# Patient Record
Sex: Male | Born: 1970 | Race: Black or African American | Hispanic: No | Marital: Married | Smoking: Never smoker
Health system: Southern US, Community
[De-identification: ages and names within clinical notes are randomized; demographics above are authoritative.]

## PROBLEM LIST (undated history)

## (undated) DIAGNOSIS — I428 Other cardiomyopathies: Secondary | ICD-10-CM

## (undated) DIAGNOSIS — R7303 Prediabetes: Secondary | ICD-10-CM

## (undated) DIAGNOSIS — M109 Gout, unspecified: Secondary | ICD-10-CM

## (undated) DIAGNOSIS — I10 Essential (primary) hypertension: Secondary | ICD-10-CM

## (undated) DIAGNOSIS — D869 Sarcoidosis, unspecified: Secondary | ICD-10-CM

## (undated) HISTORY — PX: NO PAST SURGERIES: SHX2092

## (undated) HISTORY — DX: Sarcoidosis, unspecified: D86.9

## (undated) HISTORY — DX: Other cardiomyopathies: I42.8

## (undated) HISTORY — DX: Morbid (severe) obesity due to excess calories: E66.01

## (undated) HISTORY — DX: Essential (primary) hypertension: I10

## (undated) HISTORY — DX: Prediabetes: R73.03

## (undated) HISTORY — DX: Gout, unspecified: M10.9

---

## 2008-06-15 ENCOUNTER — Ambulatory Visit: Payer: Self-pay | Admitting: Family Medicine

## 2008-06-15 DIAGNOSIS — F528 Other sexual dysfunction not due to a substance or known physiological condition: Secondary | ICD-10-CM

## 2008-06-15 DIAGNOSIS — R350 Frequency of micturition: Secondary | ICD-10-CM

## 2008-06-15 LAB — CONVERTED CEMR LAB
Bilirubin Urine: NEGATIVE
Blood in Urine, dipstick: NEGATIVE
Glucose, Urine, Semiquant: NEGATIVE
Ketones, urine, test strip: NEGATIVE
Specific Gravity, Urine: 1.025
Urobilinogen, UA: 0.2

## 2008-06-16 ENCOUNTER — Encounter: Payer: Self-pay | Admitting: Family Medicine

## 2008-06-16 DIAGNOSIS — R740 Nonspecific elevation of levels of transaminase and lactic acid dehydrogenase [LDH]: Secondary | ICD-10-CM

## 2008-06-16 LAB — CONVERTED CEMR LAB
AST: 39 units/L — ABNORMAL HIGH (ref 0–37)
Alkaline Phosphatase: 286 units/L — ABNORMAL HIGH (ref 39–117)
BUN: 21 mg/dL (ref 6–23)
Basophils Relative: 0 % (ref 0–1)
Eosinophils Absolute: 0 10*3/uL (ref 0.0–0.7)
Eosinophils Relative: 0 % (ref 0–5)
Glucose, Bld: 130 mg/dL — ABNORMAL HIGH (ref 70–99)
HCT: 42.9 % (ref 39.0–52.0)
HCV Ab: NEGATIVE
Hep A IgM: NEGATIVE
Hepatitis B Surface Ag: NEGATIVE
Lymphs Abs: 0.8 10*3/uL (ref 0.7–4.0)
MCHC: 32.9 g/dL (ref 30.0–36.0)
MCV: 84 fL (ref 78.0–100.0)
PSA: 0.47 ng/mL (ref 0.10–4.00)
Platelets: 227 10*3/uL (ref 150–400)
RBC / HPF: NONE SEEN (ref <3)
Sex Hormone Binding: 22 nmol/L (ref 13–71)
Sodium: 134 meq/L — ABNORMAL LOW (ref 135–145)
Testosterone: 130.36 ng/dL — ABNORMAL LOW (ref 350–890)
Total Bilirubin: 0.4 mg/dL (ref 0.3–1.2)
Total Protein: 8.1 g/dL (ref 6.0–8.3)
WBC: 6.9 10*3/uL (ref 4.0–10.5)

## 2008-06-17 ENCOUNTER — Telehealth: Payer: Self-pay | Admitting: Family Medicine

## 2008-06-22 ENCOUNTER — Encounter: Admission: RE | Admit: 2008-06-22 | Discharge: 2008-06-22 | Payer: Self-pay | Admitting: Family Medicine

## 2008-06-26 ENCOUNTER — Encounter: Admission: RE | Admit: 2008-06-26 | Discharge: 2008-06-26 | Payer: Self-pay | Admitting: Family Medicine

## 2008-06-29 ENCOUNTER — Telehealth: Payer: Self-pay | Admitting: Family Medicine

## 2008-07-02 ENCOUNTER — Telehealth: Payer: Self-pay | Admitting: Family Medicine

## 2008-07-02 DIAGNOSIS — D869 Sarcoidosis, unspecified: Secondary | ICD-10-CM | POA: Insufficient documentation

## 2008-07-02 HISTORY — DX: Sarcoidosis, unspecified: D86.9

## 2008-07-13 ENCOUNTER — Encounter: Payer: Self-pay | Admitting: Family Medicine

## 2008-08-04 ENCOUNTER — Encounter: Payer: Self-pay | Admitting: Family Medicine

## 2009-03-26 ENCOUNTER — Ambulatory Visit: Payer: Self-pay | Admitting: Family Medicine

## 2009-03-29 ENCOUNTER — Telehealth: Payer: Self-pay | Admitting: Family Medicine

## 2009-03-29 LAB — CONVERTED CEMR LAB
BUN: 14 mg/dL (ref 6–23)
CO2: 20 meq/L (ref 19–32)
Calcium: 9.3 mg/dL (ref 8.4–10.5)
Chloride: 103 meq/L (ref 96–112)
Creatinine, Ser: 1.14 mg/dL (ref 0.40–1.50)
TSH: 2.599 microintl units/mL (ref 0.350–4.500)

## 2010-03-17 IMAGING — CT CT PELVIS W/ CM
2 of 5 series · 16 of 46 positions shown, 18 images · IV contrast (READICAT/WATER & OMNI 300/[ID])
Comparison: Ultrasound abdomen of 06/22/2008

CT ABDOMEN

CLINICAL DATA: Questioned lesion between the liver and pancreas on
ultrasound of the abdomen, some abdominal pain for 6 months

CT ABDOMEN AND PELVIS WITH CONTRAST
TECHNIQUE: Multidetector CT imaging of the abdomen and pelvis was
performed using the standard protocol following bolus
administration of intravenous contrast.
Contrast: 125 ml [id number]

[Series 2: abdomen w/ · axial · 0.83mm/px · z∈[-358,-8]mm · 13 of 81 slices shown, 15 images]
[im 6/81  soft-tissue]
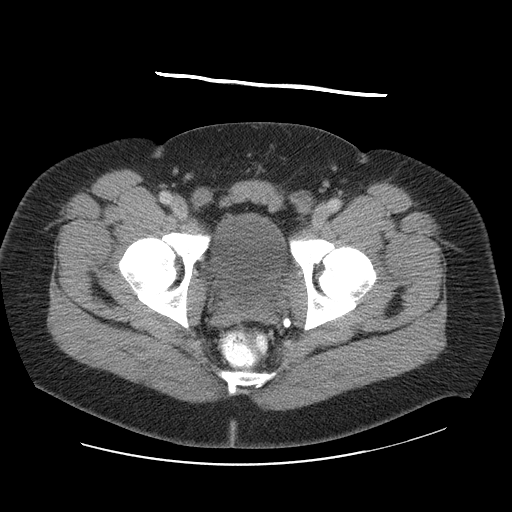
[im 6/81  bone]
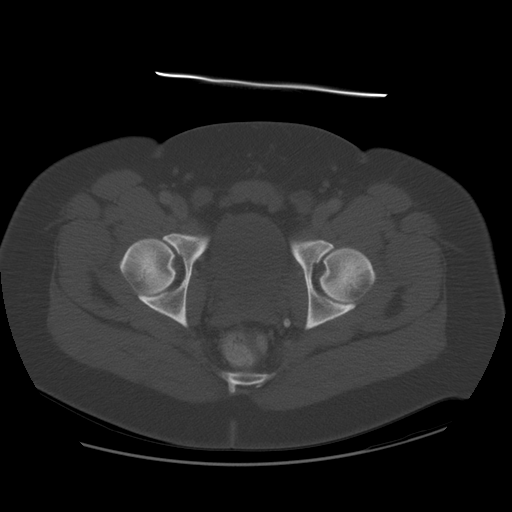
[im 11/81  soft-tissue]
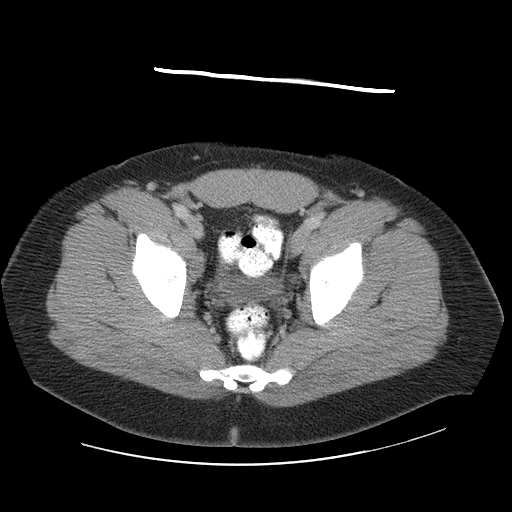
[im 16/81  soft-tissue]
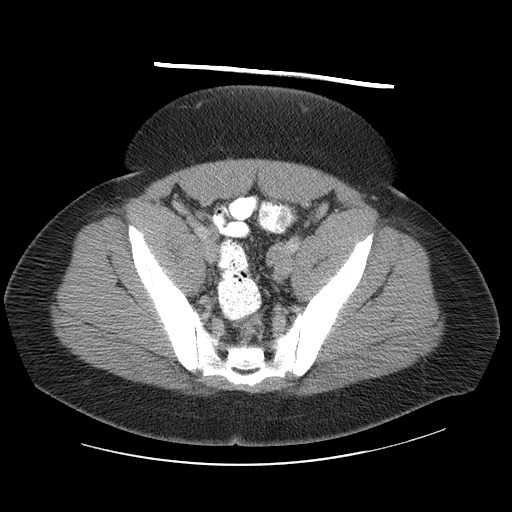
[im 26/81  soft-tissue]
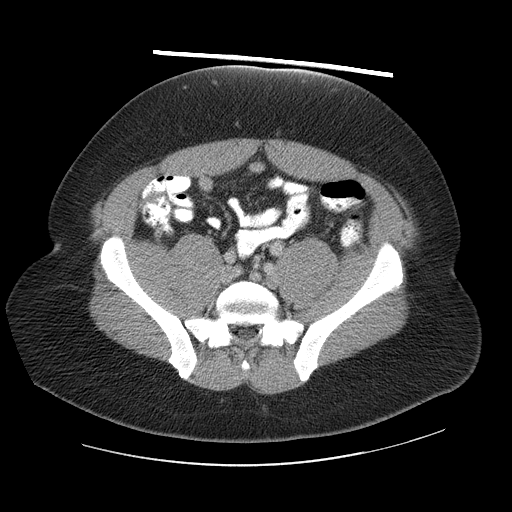
[im 31/81  soft-tissue]
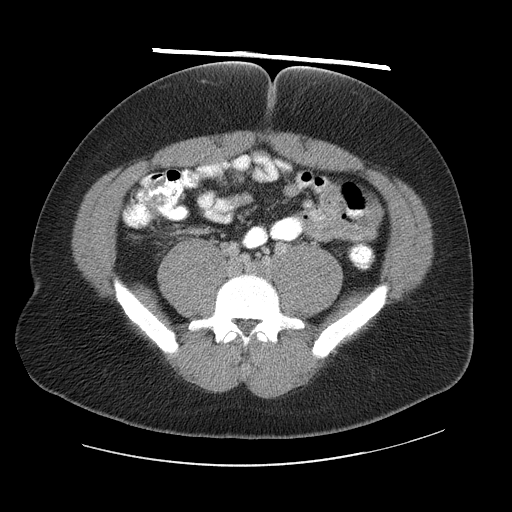
[im 36/81  soft-tissue]
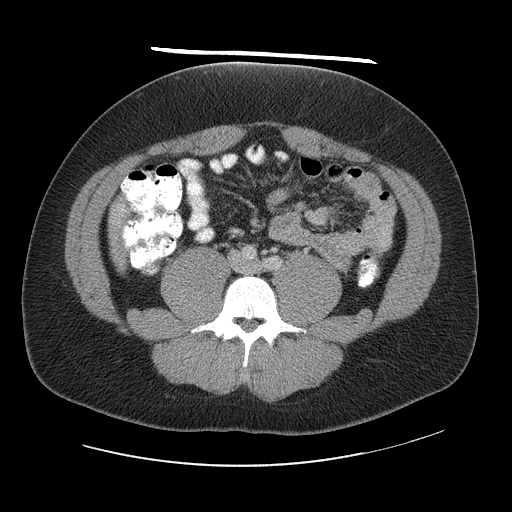
[im 41/81  soft-tissue]
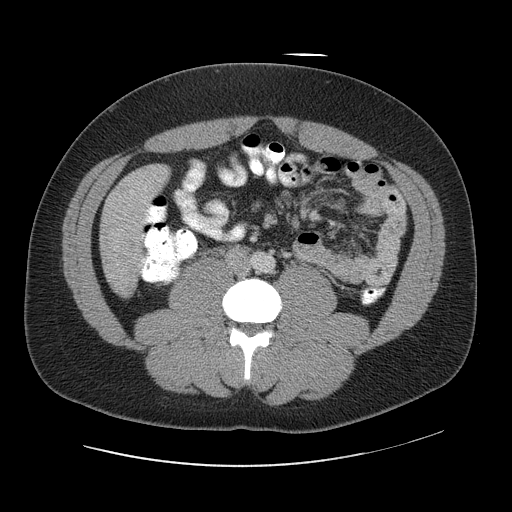
[im 46/81  soft-tissue]
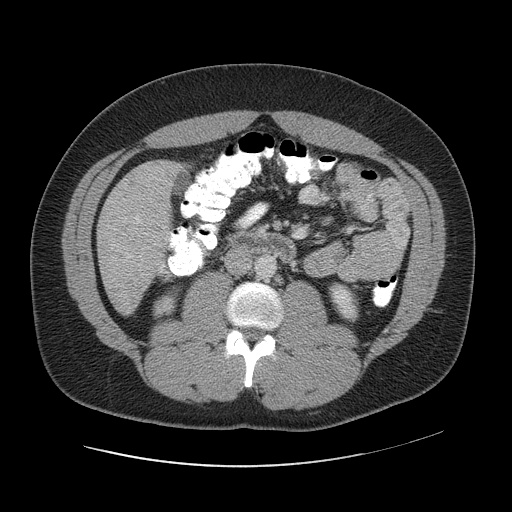
[im 51/81  soft-tissue]
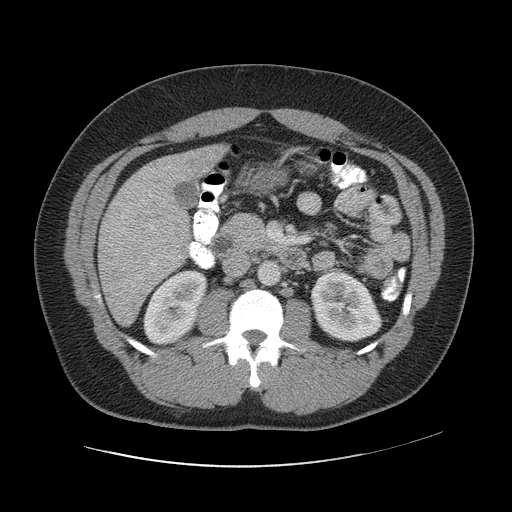
[im 51/81  bone]
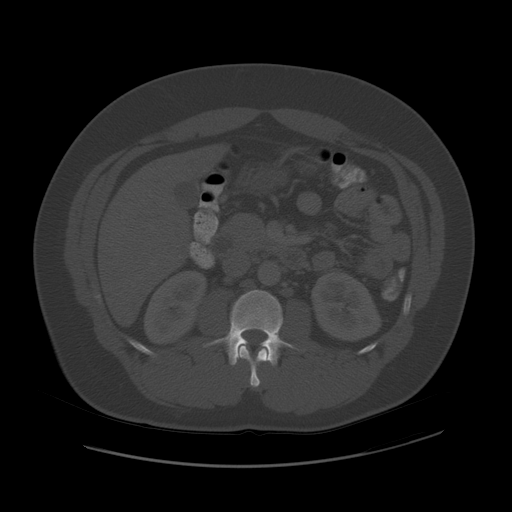
[im 56/81  soft-tissue]
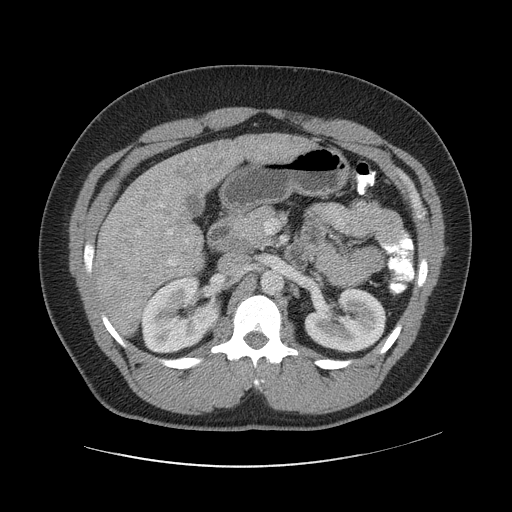
[im 66/81  soft-tissue]
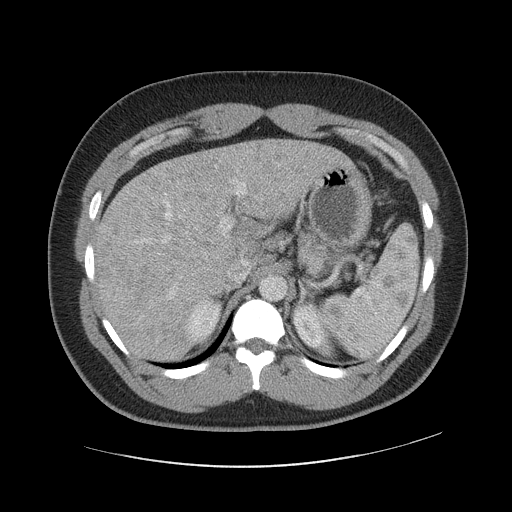
[im 71/81  soft-tissue]
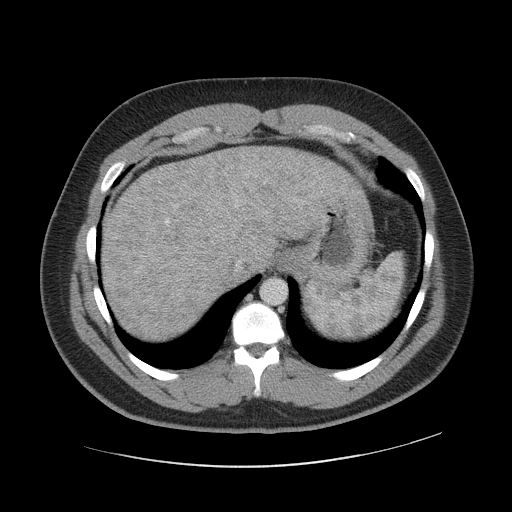
[im 76/81  soft-tissue]
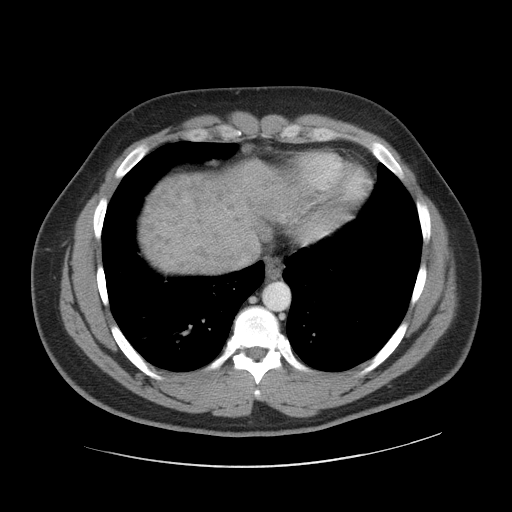

[Series 401: cor · coronal · 0.93mm/px · 3 of 127 slices shown]
[im 43/127  soft-tissue]
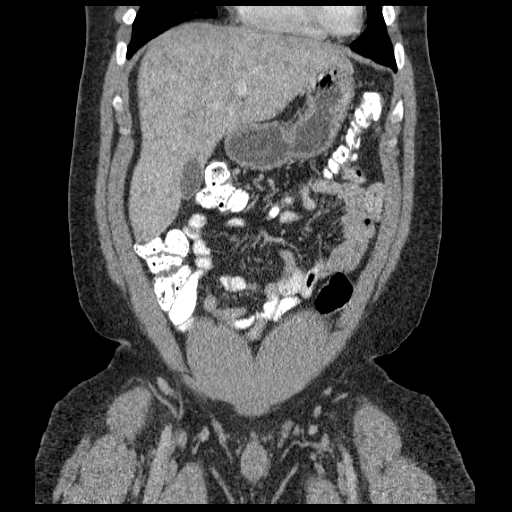
[im 57/127  soft-tissue]
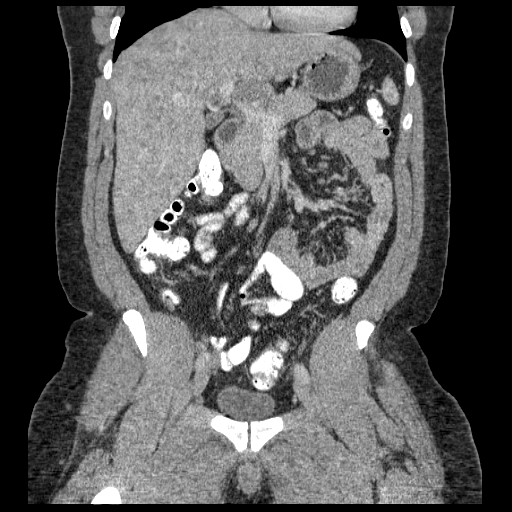
[im 71/127  soft-tissue]
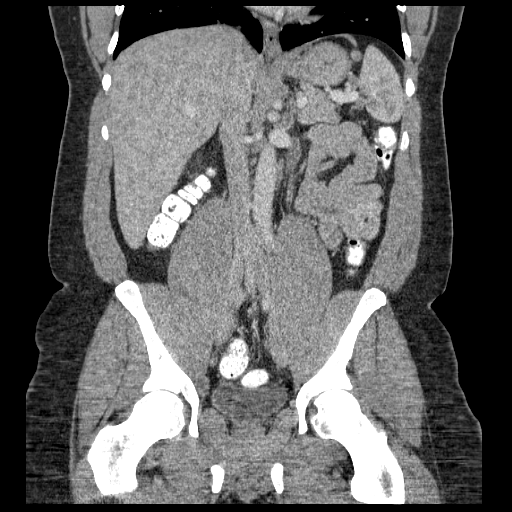

[16 of 46 positions shown; findings below may reference images not displayed]

FINDINGS: There is a reticular nodular appearance to the lower
lobes of the lungs bilaterally.  Within the liver there are
multiple low attenuation lesions scattered diffusely.  Also low
attenuation lesions are scattered throughout the spleen diffusely.
The nodes that were questioned on ultrasound are detected, being
peripancreatic as well as near the porta hepatis.  No intrahepatic
ductal dilatation is seen.  In this patient who by history is
relatively asymptomatic,  the primary consideration would be that
of sarcoidosis.  Other considerations are that of infection such as
candidiasis with metastatic  process less likely in this age group.
Chest x-ray or CT of the chest would be helpful to assess further.
No calcified gallstones are seen.  The pancreas is normal in size
and the pancreatic duct is not dilated.  The adrenal glands appear
normal.  The kidneys enhance with no focal abnormality and on
delayed images the pelvocaliceal systems appear normal.  The
abdominal aorta is normal in caliber.
IMPRESSION: 1.  Multiple low attenuation lesions scattered throughout the
entire liver and spleen with upper abdominal adenopathy and
reticular nodular appearance to the lower lung fields.  These
findings are most consistent with sarcoidosis with infectious
process such as candidiasis or less likely metastatic disease also
considerations.  Recommend chest x-ray or CT of the chest to assess
further.

CT PELVIS
FINDINGS: Appendix is well seen and appears normal.  The terminal
ileum also appears normal.  The urinary bladder is unremarkable.
The prostate is normal in size.  No colonic abnormality is seen.
No bony abnormality is noted.
IMPRESSION: Negative CT pelvis.  Appendix and terminal ileum appear normal.

## 2011-09-07 DIAGNOSIS — J849 Interstitial pulmonary disease, unspecified: Secondary | ICD-10-CM | POA: Insufficient documentation

## 2011-11-27 ENCOUNTER — Ambulatory Visit (INDEPENDENT_AMBULATORY_CARE_PROVIDER_SITE_OTHER): Payer: BC Managed Care – PPO | Admitting: Physician Assistant

## 2011-11-27 ENCOUNTER — Encounter: Payer: Self-pay | Admitting: Physician Assistant

## 2011-11-27 VITALS — BP 132/82 | HR 77 | Temp 98.4°F | Ht 72.0 in | Wt 255.0 lb

## 2011-11-27 DIAGNOSIS — J029 Acute pharyngitis, unspecified: Secondary | ICD-10-CM

## 2011-11-27 LAB — POCT RAPID STREP A (OFFICE): Rapid Strep A Screen: NEGATIVE

## 2011-11-27 MED ORDER — FLUTICASONE PROPIONATE 50 MCG/ACT NA SUSP: 2.0000 | Freq: Every day | NASAL | Status: DC

## 2011-11-27 NOTE — Progress Notes (Signed)
  Subjective:    Patient ID: Alex Odom, male    DOB: Mar 27, 1971, 41 y.o.   MRN: [id number]  HPI Patient starting having sore throat last Tuesday 6 days ago. He woke up Wednesday morning(5days ago) and his sore throat was much worse. His throat has hurt since not getting worse but staying constant. He tried numbing spray OTC and advil which helped temporarily. He has also tried nyquil with no relief. He had a fever on Tuesday and Wednesday but has not had one since. He coughs some and production is white blood-tinged sputum. He has had some SOB and wheezing and used his inhaler 2 times within this week. He has had some head congestion. He denies ear pain, nausea/vomiting.     Review of Systems     Objective:   Physical Exam  Constitutional: He is oriented to person, place, and time. He appears well-developed and well-nourished. No distress.  HENT:  Head: Normocephalic and atraumatic.  Right Ear: External ear normal.  Left Ear: External ear normal.  Mouth/Throat: No oropharyngeal exudate.       Bilateral swollen and edematous turbinates.  Erythematous oropharynx with postnasal drip present. Tonsils enlarged.  Negative for maxillary tenderness.  Eyes: Right eye exhibits no discharge. Left eye exhibits no discharge.  Neck:       Bilateral anterior cervical lymph nodes enlarged and tender to palpation.  Cardiovascular: Normal rate, regular rhythm and normal heart sounds.   Pulmonary/Chest: Effort normal and breath sounds normal. He has no wheezes.  Lymphadenopathy:    He has cervical adenopathy.  Neurological: He is alert and oriented to person, place, and time.  Skin: Skin is warm and dry. He is not diaphoretic.  Psychiatric: He has a normal mood and affect. His behavior is normal.          Assessment & Plan:  Sore throat- Rapid Strep(neg) Suspect sore throat due to post nasal drip irritation. Gave Flonase 2sprays daily. Start Mucinex DM BID with lot's of water. Try salt water  gargles along with honey lozengers and ibuprofen for pain. If not improving by Friday call office and will give abx.

## 2011-11-27 NOTE — Patient Instructions (Signed)
Start nasal spray. Mucinex DM twice a day. Gargle with salt water as needed for pain relief of sore throat. Lozengers may provide some relief along with honey. If not improving by Friday hours then call office and will prescribe and antibiotic.  Pharyngitis, Viral and Bacterial Pharyngitis is soreness (inflammation) or infection of the pharynx. It is also called a sore throat. CAUSES  Most sore throats are caused by viruses and are part of a cold. However, some sore throats are caused by strep and other bacteria. Sore throats can also be caused by post nasal drip from draining sinuses, allergies and sometimes from sleeping with an open mouth. Infectious sore throats can be spread from person to person by coughing, sneezing and sharing cups or eating utensils. TREATMENT  Sore throats that are viral usually last 3-4 days. Viral illness will get better without medications (antibiotics). Strep throat and other bacterial infections will usually begin to get better about 24-48 hours after you begin to take antibiotics. HOME CARE INSTRUCTIONS   If the caregiver feels there is a bacterial infection or if there is a positive strep test, they will prescribe an antibiotic. The full course of antibiotics must be taken. If the full course of antibiotic is not taken, you or your child may become ill again. If you or your child has strep throat and do not finish all of the medication, serious heart or kidney diseases may develop.   Drink enough water and fluids to keep your urine clear or pale yellow.   Only take over-the-counter or prescription medicines for pain, discomfort or fever as directed by your caregiver.   Get lots of rest.   Gargle with salt water ( tsp. of salt in a glass of water) as often as every 1-2 hours as you need for comfort.   Hard candies may soothe the throat if individual is not at risk for choking. Throat sprays or lozenges may also be used.  SEEK MEDICAL CARE IF:   Large, tender  lumps in the neck develop.   A rash develops.   Green, yellow-brown or bloody sputum is coughed up.   Your baby is older than 3 months with a rectal temperature of 100.5 F (38.1 C) or higher for more than 1 day.  SEEK IMMEDIATE MEDICAL CARE IF:   A stiff neck develops.   You or your child are drooling or unable to swallow liquids.   You or your child are vomiting, unable to keep medications or liquids down.   You or your child has severe pain, unrelieved with recommended medications.   You or your child are having difficulty breathing (not due to stuffy nose).   You or your child are unable to fully open your mouth.   You or your child develop redness, swelling, or severe pain anywhere on the neck.   You have a fever.   Your baby is older than 3 months with a rectal temperature of 102 F (38.9 C) or higher.   Your baby is 36 months old or younger with a rectal temperature of 100.4 F (38 C) or higher.  MAKE SURE YOU:   Understand these instructions.   Will watch your condition.   Will get help right away if you are not doing well or get worse.  Document Released: 10/30/2005 Document Revised: 07/12/2011 Document Reviewed: 01/27/2008 [facility] Patient Information 2012 [place], [place].

## 2011-11-30 ENCOUNTER — Telehealth: Payer: Self-pay | Admitting: *Deleted

## 2011-11-30 ENCOUNTER — Encounter: Payer: Self-pay | Admitting: Family Medicine

## 2011-11-30 ENCOUNTER — Ambulatory Visit (INDEPENDENT_AMBULATORY_CARE_PROVIDER_SITE_OTHER): Payer: BC Managed Care – PPO | Admitting: Family Medicine

## 2011-11-30 DIAGNOSIS — K146 Glossodynia: Secondary | ICD-10-CM

## 2011-11-30 MED ORDER — MAGIC MOUTHWASH: 10.0000 mL | Freq: Three times a day (TID) | ORAL | Status: DC

## 2011-11-30 NOTE — Telephone Encounter (Signed)
Put on [place] or [place] schedule.

## 2011-11-30 NOTE — Telephone Encounter (Signed)
Pt was seen on 11-27-11 with a sore throat. Pt states that today he has red bumps at the back of his tongue and that it is swollen. States that throat feels better.please advise.

## 2011-11-30 NOTE — Progress Notes (Signed)
  Subjective:    Patient ID: Alex Odom, male    DOB: [birth year], 41 y.o.   MRN: [id number]  HPI  Her complaints of bumps on his tongue. He had a sore throat for about a week and then came in 3 days again to see the PA for evaluation. She gets off his throat and he was negative for strep. He says his throat is actually better today but then he noticed some bumps on his tongue this morning I were very tender. There are very back of his tongue. He was initially drinking a lot of orange juice but says his throat became sore so quit drinking now. He says he hasn't used any cough drops etc. in about 3 days.  Review of Systems     Objective:   Physical Exam  Constitutional: He is oriented to person, place, and time. He appears well-developed and well-nourished.  HENT:  Head: Normocephalic and atraumatic.  Right Ear: External ear normal.  Left Ear: External ear normal.  Nose: Nose normal.  Mouth/Throat: Oropharynx is clear and moist.       TMs and canals are clear.   Eyes: Conjunctivae and EOM are normal. Pupils are equal, round, and reactive to light.  Neck: Neck supple. No thyromegaly present.  Cardiovascular: Normal rate and normal heart sounds.   Pulmonary/Chest: Effort normal and breath sounds normal.  Lymphadenopathy:    He has no cervical adenopathy.  Neurological: He is alert and oriented to person, place, and time.  Skin: Skin is warm and dry.  Psychiatric: He has a normal mood and affect.          Assessment & Plan:  The bumps on his tongue looked normal. He does look like an enlarged inflamed papilla. We discussed avoiding acidic foods and cough drops etc. that tend to irritate these things. Also given prescription for Magic mouthwash to use up to 4 times a day to see the area. He is still having difficulty or pain by Monday then please call her office back.

## 2011-11-30 NOTE — Telephone Encounter (Signed)
Pt.notified

## 2011-11-30 NOTE — Patient Instructions (Signed)
Call if not better by Monday  

## 2012-12-31 DIAGNOSIS — M109 Gout, unspecified: Secondary | ICD-10-CM

## 2012-12-31 DIAGNOSIS — I10 Essential (primary) hypertension: Secondary | ICD-10-CM | POA: Insufficient documentation

## 2012-12-31 DIAGNOSIS — I428 Other cardiomyopathies: Secondary | ICD-10-CM

## 2012-12-31 HISTORY — DX: Morbid (severe) obesity due to excess calories: E66.01

## 2012-12-31 HISTORY — DX: Essential (primary) hypertension: I10

## 2012-12-31 HISTORY — DX: Other cardiomyopathies: I42.8

## 2012-12-31 HISTORY — DX: Gout, unspecified: M10.9

## 2013-08-08 ENCOUNTER — Encounter: Payer: Self-pay | Admitting: Family Medicine

## 2013-08-08 ENCOUNTER — Ambulatory Visit (INDEPENDENT_AMBULATORY_CARE_PROVIDER_SITE_OTHER): Payer: BC Managed Care – PPO | Admitting: Family Medicine

## 2013-08-08 VITALS — BP 130/78 | HR 77 | Wt 266.0 lb

## 2013-08-08 DIAGNOSIS — R05 Cough: Secondary | ICD-10-CM

## 2013-08-08 DIAGNOSIS — R490 Dysphonia: Secondary | ICD-10-CM

## 2013-08-08 DIAGNOSIS — K1120 Sialoadenitis, unspecified: Secondary | ICD-10-CM

## 2013-08-08 DIAGNOSIS — D869 Sarcoidosis, unspecified: Secondary | ICD-10-CM

## 2013-08-08 MED ORDER — FLUCONAZOLE 100 MG PO TABS: 100.0000 mg | ORAL_TABLET | Freq: Every day | ORAL | Status: DC

## 2013-08-08 NOTE — Progress Notes (Signed)
Subjective:    Patient ID: Alex Odom, male    DOB: August 16, 1971, 42 y.o.   MRN: [id number]  HPI Swollen glands in neck x 1 month. Says they swell and get worse when he eats.  Better with rest.  Says has been coughing up white phlegm. No fever or temp. NO ear pain.  No ST. No nasal congestion. No fatigue. He otherwise feels well. 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 He also says he uses an inhaler for his sarcoidosis but is not sure which one. He says it used to be on Advair but he is on something different now. He says that he's not had any sore throat or throat pain but has noticed that his voice is very hoarse.   Review of Systems There were no vitals taken for this visit.    No Known Allergies  No past medical history on file.  No past surgical history on file.  History   Social History  . Marital Status: Married    Spouse Name: N/A    Number of Children: N/A  . Years of Education: N/A   Occupational History  . Not on file.   Social History Main Topics  . Smoking status: Never Smoker   . Smokeless tobacco: Not on file  . Alcohol Use: Not on file  . Drug Use: Not on file  . Sexual Activity: Not on file   Other Topics Concern  . Not on file   Social History Narrative  . No narrative on file    No family history on file.  Outpatient Encounter Prescriptions as of 08/08/2013  Medication Sig Dispense Refill  . fluconazole (DIFLUCAN) 100 MG tablet Take 1 tablet (100 mg total) by mouth daily.  10 tablet  0  . fluticasone (FLONASE) 50 MCG/ACT nasal spray Place 2 sprays into the nose daily.  1 g  2  . [DISCONTINUED] Alum & Mag Hydroxide-Simeth (MAGIC MOUTHWASH) SOLN Take 10 mLs by mouth 4 (four) times daily - after meals and at bedtime.  150 mL  0  . [DISCONTINUED] fluconazole (DIFLUCAN) 100 MG tablet Take 1 tablet (100 mg total) by mouth daily.  10 tablet  0   No facility-administered encounter medications on file as of  08/08/2013.           Objective:   Physical Exam  Constitutional: He is oriented to person, place, and time. He appears well-developed and well-nourished.  HENT:  Head: Normocephalic and atraumatic.  Right Ear: External ear normal.  Left Ear: External ear normal.  Nose: Nose normal.  Mouth/Throat: Oropharynx is clear and moist.  TMs and canals are clear.   Eyes: Conjunctivae and EOM are normal. Pupils are equal, round, and reactive to light.  Neck: Neck supple. No thyromegaly present.  He has bilateral swelling of the salivary glands underneath the jaw. They are irregular and oblong. They are nontender to touch. No other swollen lymph nodes in the cervical area or posterior her regular area.  Cardiovascular: Normal rate and normal heart sounds.   Pulmonary/Chest: Effort normal and breath sounds normal.  Lymphadenopathy:    He has no cervical adenopathy.  Neurological: He is alert and oriented to person, place, and time.  Skin: Skin is warm and dry.  Psychiatric: He has a normal mood and affect.          Assessment & Plan:  Suspect sialadenitis based on exam and history today. I'll refer  to ENT for further evaluation and treatment.  Voice hoarseness-with his use of prednisone and inhalers I suspect that he probably has thrush. We'll treat with Diflucan 100 mg daily x10 days. If she's not significantly better by the time he sees the ENT specialist he can have them evaluate the area.

## 2013-08-11 ENCOUNTER — Encounter: Payer: Self-pay | Admitting: Family Medicine

## 2014-12-18 ENCOUNTER — Ambulatory Visit (INDEPENDENT_AMBULATORY_CARE_PROVIDER_SITE_OTHER): Payer: 59 | Admitting: Family Medicine

## 2014-12-18 ENCOUNTER — Encounter: Payer: Self-pay | Admitting: Family Medicine

## 2014-12-18 VITALS — BP 136/78 | HR 74 | Ht 72.0 in | Wt 273.0 lb

## 2014-12-18 DIAGNOSIS — R350 Frequency of micturition: Secondary | ICD-10-CM

## 2014-12-18 DIAGNOSIS — Z113 Encounter for screening for infections with a predominantly sexual mode of transmission: Secondary | ICD-10-CM

## 2014-12-18 LAB — POCT URINALYSIS DIPSTICK
Bilirubin, UA: NEGATIVE
Glucose, UA: NEGATIVE
KETONES UA: NEGATIVE
LEUKOCYTES UA: NEGATIVE
NITRITE UA: NEGATIVE
Protein, UA: NEGATIVE
RBC UA: NEGATIVE
SPEC GRAV UA: 1.02
UROBILINOGEN UA: 0.2
pH, UA: 6

## 2014-12-18 NOTE — Addendum Note (Signed)
Addended by: Deno EtienneBARKLEY, Danasha Melman L on: 12/18/2014 04:56 PM   Modules accepted: Orders

## 2014-12-18 NOTE — Progress Notes (Signed)
   Subjective:    Patient ID: Alex BandaDavid L Odom, male    DOB: 15-Jun-1971, 44 y.o.   MRN: [id number]  HPI  Would like to get "checked". Says his wife has had BV twcie and he wants to make sure he is ok.  He is circumcized and hasn't had any rash or lesion.  No burning or pain. No urinary sxs.  He denies any personal sxs.  Wife has been experiencing burning sesation.    Review of Systems     Objective:   Physical Exam  Constitutional: He is oriented to person, place, and time. He appears well-developed and well-nourished.  HENT:  Head: Normocephalic and atraumatic.  Neurological: He is alert and oriented to person, place, and time.  Skin: Skin is warm and dry.  Psychiatric: He has a normal mood and affect. His behavior is normal.          Assessment & Plan:  Concern for STD - The urinalysis to send for urine GC and chlamydia. We'll also do a urinalysis to check for bacterial infection. We'll also check for HIV and syphilis.

## 2014-12-19 LAB — GC/CHLAMYDIA PROBE AMP, URINE
CHLAMYDIA, SWAB/URINE, PCR: NEGATIVE
GC Probe Amp, Urine: NEGATIVE

## 2014-12-19 LAB — HIV ANTIBODY (ROUTINE TESTING W REFLEX): HIV: NONREACTIVE

## 2014-12-19 LAB — RPR

## 2016-03-22 LAB — LIPID PANEL
CHOLESTEROL: 147 mg/dL (ref 0–200)
HDL: 37 mg/dL (ref 35–70)
LDL CALC: 82 mg/dL
TRIGLYCERIDES: 176 mg/dL — AB (ref 40–160)

## 2016-03-22 LAB — URIC ACID: Uric Acid: 8.5

## 2016-03-22 LAB — HEPATIC FUNCTION PANEL
ALK PHOS: 170 U/L — AB (ref 25–125)
ALT: 34 U/L (ref 10–40)
AST: 29 U/L (ref 14–40)
Bilirubin, Total: 0.5 mg/dL

## 2016-03-22 LAB — BASIC METABOLIC PANEL
BUN: 18 mg/dL (ref 4–21)
Creatinine: 1.2 mg/dL (ref 0.6–1.3)
Glucose: 82 mg/dL
Potassium: 3.8 mmol/L (ref 3.4–5.3)
Sodium: 135 mmol/L — AB (ref 137–147)

## 2016-03-22 LAB — HEPATITIS A ANTIBODY, IGM: HEP A IGM: NEGATIVE

## 2016-03-22 LAB — HEMOGLOBIN A1C: Hemoglobin A1C: 5.9

## 2016-03-22 LAB — HEPATITIS B SURFACE ANTIBODY,QUALITATIVE: HEPATITIS B SURF AB QUANT: NEGATIVE

## 2016-03-27 ENCOUNTER — Encounter: Payer: Self-pay | Admitting: Family Medicine

## 2016-04-03 ENCOUNTER — Encounter: Payer: Self-pay | Admitting: Family Medicine

## 2016-08-29 ENCOUNTER — Encounter: Payer: 59 | Admitting: Family Medicine

## 2017-01-26 ENCOUNTER — Ambulatory Visit (INDEPENDENT_AMBULATORY_CARE_PROVIDER_SITE_OTHER): Payer: 59 | Admitting: Family Medicine

## 2017-01-26 ENCOUNTER — Encounter: Payer: Self-pay | Admitting: Family Medicine

## 2017-01-26 VITALS — BP 132/74 | HR 75 | Ht 72.0 in | Wt 274.0 lb

## 2017-01-26 DIAGNOSIS — Z23 Encounter for immunization: Secondary | ICD-10-CM | POA: Diagnosis not present

## 2017-01-26 DIAGNOSIS — R252 Cramp and spasm: Secondary | ICD-10-CM

## 2017-01-26 DIAGNOSIS — D860 Sarcoidosis of lung: Secondary | ICD-10-CM

## 2017-01-26 DIAGNOSIS — R350 Frequency of micturition: Secondary | ICD-10-CM | POA: Diagnosis not present

## 2017-01-26 DIAGNOSIS — R7301 Impaired fasting glucose: Secondary | ICD-10-CM

## 2017-01-26 LAB — POCT URINALYSIS DIPSTICK
BILIRUBIN UA: NEGATIVE
GLUCOSE UA: NEGATIVE
KETONES UA: NEGATIVE
Leukocytes, UA: NEGATIVE
NITRITE UA: NEGATIVE
PH UA: 5.5 (ref 5.0–8.0)
Protein, UA: 30
RBC UA: NEGATIVE
Spec Grav, UA: 1.025 (ref 1.030–1.035)
Urobilinogen, UA: 0.2 (ref ?–2.0)

## 2017-01-26 LAB — POCT GLYCOSYLATED HEMOGLOBIN (HGB A1C): Hemoglobin A1C: 6.1

## 2017-01-26 NOTE — Progress Notes (Signed)
Subjective:    Patient ID: Alex Odom, male    DOB: [birth year], 46 y.o.   MRN: [id number]  HPI 46 yo male with Urinary Frequency 2 weeks. He says he feels like he has to go every 2 and half to 3 and half hours which is not like him. He denies any recent changes to his medication regimen. He had similar sx about 2 years ago and had a neg urine and neg STD workup.  He is long term steroid for sarcoidosis.  He says he has had a little discomfort/burning at the end of urination but it's very mild.   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 Hand fact is [facility].  \He has been having a few more abdominal cramps especially while doing crunches. He notices this was new. He did have one night where he had them in his feet. He's also had a little bit of gurgling in the left upper quadrant area. He said he actually was even on a reflux medicine for a short period of time it has gotten a little bit better.  Review of Systems   BP 132/74   Pulse 75   Ht 6' (1.829 m)   Wt 274 lb (124.3 kg)   SpO2 98%   BMI 37.16 kg/m     No Known Allergies  No past medical history on file.  No past surgical history on file.  Social History   Social History  . Marital status: Married    Spouse name: N/A  . Number of children: N/A  . Years of education: N/A   Occupational History  . Not on file.   Social History Main Topics  . Smoking status: Never Smoker  . Smokeless tobacco: Never Used  . Alcohol use Not on file  . Drug use: Unknown  . Sexual activity: Not on file   Other Topics Concern  . Not on file   Social History Narrative  . No narrative on file    No family history on file.  Outpatient Encounter Prescriptions as of 01/26/2017  Medication Sig  . allopurinol (ZYLOPRIM) 300 MG tablet Take 300 mg by mouth.  . predniSONE (DELTASONE) 10 MG tablet Take 10 mg by mouth daily with  breakfast.  . [DISCONTINUED] pneumococcal 23 valent vaccine (PNU-IMMUNE) 25 MCG/0.5ML injection Inject 0.5 mLs into the muscle.  . [DISCONTINUED] predniSONE (DELTASONE) 5 MG tablet Take 10 mg by mouth daily.   No facility-administered encounter medications on file as of 01/26/2017.          Objective:   Physical Exam  Constitutional: He is oriented to person, place, and time. He appears well-developed and well-nourished.  HENT:  Head: Normocephalic and atraumatic.  Right Ear: External ear normal.  Left Ear: External ear normal.  Nose: Nose normal.  Mouth/Throat: Oropharynx is clear and moist.  TMs and canals are clear.   Eyes: Conjunctivae and EOM are normal. Pupils are equal, round, and reactive to light.  Neck: Neck supple. No thyromegaly present.  Cardiovascular: Normal rate and normal heart sounds.   Pulmonary/Chest: Effort normal and breath sounds normal.  Abdominal: Soft. Bowel sounds are normal. He exhibits no distension and no mass. There is no tenderness. There is no rebound and no guarding.  Lymphadenopathy:    He has no cervical adenopathy.  Neurological: He is alert and oriented to person, place, and time.  Skin: Skin is warm and  dry.  Psychiatric: He has a normal mood and affect.         assessment and plan  Urinary frequency-discussed that this could become been from some elevated blood sugars. But we'll send the urine for a urine culture since urinalysis is essentially negative except for protein.   IFG - A1C of 6.1 today.  we discussed dietary changes. Avoid concentrated sweets and reduce portion sizes on carbohydrates intake. Make sure drinking plenty of water. Did encourage him to exercise and work on weight loss. Follow-up in 6 months.   Muscle cramping-we'll check electrolytes area did

## 2017-01-27 LAB — COMPLETE METABOLIC PANEL WITH GFR
ALBUMIN: 3.6 g/dL (ref 3.6–5.1)
ALK PHOS: 152 U/L — AB (ref 40–115)
ALT: 36 U/L (ref 9–46)
AST: 28 U/L (ref 10–40)
BILIRUBIN TOTAL: 0.4 mg/dL (ref 0.2–1.2)
BUN: 15 mg/dL (ref 7–25)
CO2: 25 mmol/L (ref 20–31)
CREATININE: 1.18 mg/dL (ref 0.60–1.35)
Calcium: 9 mg/dL (ref 8.6–10.3)
Chloride: 104 mmol/L (ref 98–110)
GFR, Est African American: 84 mL/min (ref >=60)
GFR, Est Non African American: 73 mL/min (ref >=60)
GLUCOSE: 111 mg/dL — AB (ref 65–99)
Potassium: 4 mmol/L (ref 3.5–5.3)
SODIUM: 138 mmol/L (ref 135–146)
TOTAL PROTEIN: 7.2 g/dL (ref 6.1–8.1)

## 2017-01-27 LAB — CBC WITH DIFFERENTIAL/PLATELET
BASOS ABS: 0 {cells}/uL (ref 0–200)
Basophils Relative: 0 %
EOS ABS: 276 {cells}/uL (ref 15–500)
EOS PCT: 6 %
HCT: 42.3 % (ref 38.5–50.0)
Hemoglobin: 13.6 g/dL (ref 13.2–17.1)
Lymphocytes Relative: 28 %
Lymphs Abs: 1288 cells/uL (ref 850–3900)
MCH: 26.9 pg — ABNORMAL LOW (ref 27.0–33.0)
MCHC: 32.2 g/dL (ref 32.0–36.0)
MCV: 83.8 fL (ref 80.0–100.0)
MONOS PCT: 11 %
MPV: 10.9 fL (ref 7.5–12.5)
Monocytes Absolute: 506 cells/uL (ref 200–950)
NEUTROS PCT: 55 %
Neutro Abs: 2530 cells/uL (ref 1500–7800)
Platelets: 164 10*3/uL (ref 140–400)
RBC: 5.05 MIL/uL (ref 4.20–5.80)
RDW: 14.9 % (ref 11.0–15.0)
WBC: 4.6 10*3/uL (ref 3.8–10.8)

## 2017-01-27 LAB — SEDIMENTATION RATE: Sed Rate: 20 mm/hr — ABNORMAL HIGH (ref 0–15)

## 2017-01-27 LAB — MAGNESIUM: MAGNESIUM: 1.7 mg/dL (ref 1.5–2.5)

## 2017-01-31 LAB — URINE CULTURE: ORGANISM ID, BACTERIA: NO GROWTH

## 2017-02-05 ENCOUNTER — Telehealth: Payer: Self-pay

## 2017-02-05 DIAGNOSIS — R309 Painful micturition, unspecified: Secondary | ICD-10-CM

## 2017-02-05 NOTE — Telephone Encounter (Signed)
Call pt: If he still having symptoms I think we need to get him in with urology for further evaluation. If he is okay with this and please go ahead and place the referral. Can try to schedule with [facility] [place] if he is okay with that.

## 2017-02-05 NOTE — Telephone Encounter (Signed)
Pt called and is still having burning with urination.  Please advise.

## 2017-02-05 NOTE — Telephone Encounter (Signed)
Referral placed.

## 2017-08-30 ENCOUNTER — Encounter: Payer: Self-pay | Admitting: Family Medicine

## 2017-08-30 ENCOUNTER — Ambulatory Visit (INDEPENDENT_AMBULATORY_CARE_PROVIDER_SITE_OTHER): Payer: 59 | Admitting: Family Medicine

## 2017-08-30 VITALS — BP 125/83 | HR 55 | Ht 72.0 in | Wt 255.0 lb

## 2017-08-30 DIAGNOSIS — Z23 Encounter for immunization: Secondary | ICD-10-CM

## 2017-08-30 DIAGNOSIS — R7309 Other abnormal glucose: Secondary | ICD-10-CM | POA: Diagnosis not present

## 2017-08-30 DIAGNOSIS — R7303 Prediabetes: Secondary | ICD-10-CM

## 2017-08-30 DIAGNOSIS — Z125 Encounter for screening for malignant neoplasm of prostate: Secondary | ICD-10-CM

## 2017-08-30 DIAGNOSIS — Z0000 Encounter for general adult medical examination without abnormal findings: Secondary | ICD-10-CM | POA: Diagnosis not present

## 2017-08-30 HISTORY — DX: Prediabetes: R73.03

## 2017-08-30 NOTE — Progress Notes (Signed)
   Subjective:    Patient ID: Alex Odom, male    DOB: 10-Aug-1971, 46 y.o.   MRN: [id number]  HPI 46 year old male is here today for complete physical exam. He is doing well overall. [facility] is [facility]. He is getting a new pulmonologist he typically sees a resident each year. He says overall he has been doing well. He's only had to use one round of prednisone over the last 3 months. He reports that his pneumonia vaccine and tetanus are up to date. His last eye exam was in 2016. Lantus to schedule one next year   Review of Systems   Comprehensive review of systems is negative except for history of present illness.  BP 125/83   Pulse (!) 55   Ht 6' (1.829 m)   Wt 255 lb (115.7 kg)   SpO2 100%   BMI 34.58 kg/m     No Known Allergies  No past medical history on file.  Past Surgical History:  Procedure Laterality Date  . NO PAST SURGERIES      Social History   Social History  . Marital status: Married    Spouse name: N/A  . Number of children: N/A  . Years of education: N/A   Occupational History  . Not on file.   Social History Main Topics  . Smoking status: Never Smoker  . Smokeless tobacco: Never Used  . Alcohol use Not on file  . Drug use: Unknown  . Sexual activity: Not on file   Other Topics Concern  . Not on file   Social History Narrative  . No narrative on file    History reviewed. No pertinent family history.  Outpatient Encounter Prescriptions as of 08/30/2017  Medication Sig  . [DISCONTINUED] allopurinol (ZYLOPRIM) 300 MG tablet Take 300 mg by mouth.  . [DISCONTINUED] predniSONE (DELTASONE) 10 MG tablet Take 10 mg by mouth daily with breakfast.   No facility-administered encounter medications on file as of 08/30/2017.           Objective:   Physical Exam  Constitutional: He is oriented to person, place, and time. He appears well-developed and well-nourished.  HENT:  Head: Normocephalic and atraumatic.   Right Ear: External ear normal.  Left Ear: External ear normal.  Nose: Nose normal.  Mouth/Throat: Oropharynx is clear and moist.  Eyes: Pupils are equal, round, and reactive to light. Conjunctivae and EOM are normal.  Neck: Normal range of motion. Neck supple. No thyromegaly present.  Cardiovascular: Normal rate, regular rhythm, normal heart sounds and intact distal pulses.   Pulmonary/Chest: Effort normal and breath sounds normal.  Abdominal: Soft. Bowel sounds are normal. He exhibits no distension and no mass. There is no tenderness. There is no rebound and no guarding.  Musculoskeletal: Normal range of motion.  Lymphadenopathy:    He has no cervical adenopathy.  Neurological: He is alert and oriented to person, place, and time. He has normal reflexes.  Skin: Skin is warm and dry.  Psychiatric: He has a normal mood and affect. His behavior is normal. Judgment and thought content normal.       Assessment & Plan:  CPE Keep up a regular exercise program and make sure you are eating a healthy diet Try to eat 4 servings of dairy a day, or if you are lactose intolerant take a calcium with vitamin D daily.  Your vaccines are up to date.   Elevated glucose secondary to chronic steroids from sarcoidosis.

## 2017-08-30 NOTE — Patient Instructions (Addendum)

## 2017-08-31 LAB — COMPLETE METABOLIC PANEL WITH GFR
AG Ratio: 1.1 (calc) (ref 1.0–2.5)
ALBUMIN MSPROF: 4 g/dL (ref 3.6–5.1)
ALT: 31 U/L (ref 9–46)
AST: 28 U/L (ref 10–40)
Alkaline phosphatase (APISO): 149 U/L — ABNORMAL HIGH (ref 40–115)
BUN: 16 mg/dL (ref 7–25)
CO2: 26 mmol/L (ref 20–32)
Calcium: 9.2 mg/dL (ref 8.6–10.3)
Chloride: 104 mmol/L (ref 98–110)
Creat: 1.29 mg/dL (ref 0.60–1.35)
GFR, EST AFRICAN AMERICAN: 75 mL/min/{1.73_m2} (ref >=60)
GFR, EST NON AFRICAN AMERICAN: 65 mL/min/{1.73_m2} (ref >=60)
Globulin: 3.5 g/dL (calc) (ref 1.9–3.7)
Glucose, Bld: 96 mg/dL (ref 65–99)
Potassium: 4 mmol/L (ref 3.5–5.3)
Sodium: 138 mmol/L (ref 135–146)
TOTAL PROTEIN: 7.5 g/dL (ref 6.1–8.1)
Total Bilirubin: 0.5 mg/dL (ref 0.2–1.2)

## 2017-08-31 LAB — HEMOGLOBIN A1C
EAG (MMOL/L): 6.8 (calc)
Hgb A1c MFr Bld: 5.9 % of total Hgb — ABNORMAL HIGH (ref <5.7)
Mean Plasma Glucose: 123 (calc)

## 2017-08-31 LAB — LIPID PANEL W/REFLEX DIRECT LDL
CHOL/HDL RATIO: 3.3 (calc) (ref <5.0)
CHOLESTEROL: 158 mg/dL (ref <200)
HDL: 48 mg/dL (ref >40)
LDL Cholesterol (Calc): 91 mg/dL (calc)
NON-HDL CHOLESTEROL (CALC): 110 mg/dL (ref <130)
TRIGLYCERIDES: 93 mg/dL (ref <150)

## 2017-08-31 LAB — PSA: PSA: 0.4 ng/mL (ref <=4.0)

## 2018-03-08 ENCOUNTER — Ambulatory Visit (INDEPENDENT_AMBULATORY_CARE_PROVIDER_SITE_OTHER): Payer: 59 | Admitting: Family Medicine

## 2018-03-08 ENCOUNTER — Encounter: Payer: Self-pay | Admitting: Family Medicine

## 2018-03-08 ENCOUNTER — Ambulatory Visit (INDEPENDENT_AMBULATORY_CARE_PROVIDER_SITE_OTHER): Payer: 59

## 2018-03-08 VITALS — BP 133/76

## 2018-03-08 DIAGNOSIS — M533 Sacrococcygeal disorders, not elsewhere classified: Secondary | ICD-10-CM

## 2018-03-08 NOTE — Progress Notes (Signed)
Subjective:    Patient ID: Alex BandaDavid L Odom, male    DOB: 1970-12-29, 47 y.o.   MRN: [id number]  HPI 47 year old male with a [facility] comes in today complaining of pain over his tailbone. pt reports that he has been in experiencining pain in his tailbone when he sitting for the past yr. the pain is 10/10 he hasn't taken any meds or used any heat or ice he has tried stretches. denies any injury or trauma.  He says it feels better when he stands and is more painful when he actually leans back in a chair.  Denies any rash, skin changes or abscess.  He says sometimes he has palms with sciatica but not recently.  Does commute for his job and so does long car rides every day.   Review of Systems  BP 133/76     No Known Allergies  History reviewed. No pertinent past medical history.  Past Surgical History:  Procedure Laterality Date  . NO PAST SURGERIES      Social History   Socioeconomic History  . Marital status: Married    Spouse name: Not on file  . Number of children: Not on file  . Years of education: Not on file  . Highest education level: Not on file  Occupational History  . Not on file  Social Needs  . Financial resource strain: Not on file  . Food insecurity:    Worry: Not on file    Inability: Not on file  . Transportation needs:    Medical: Not on file    Non-medical: Not on file  Tobacco Use  . Smoking status: Never Smoker  . Smokeless tobacco: Never Used  Substance and Sexual Activity  . Alcohol use: Not on file  . Drug use: Not on file  . Sexual activity: Not on file  Lifestyle  . Physical activity:    Days per week: Not on file    Minutes per session: Not on file  . Stress: Not on file  Relationships  . Social connections:    Talks on phone: Not on file    Gets together: Not on file    Attends religious service: Not on file    Active member of club or organization: Not on file    Attends meetings of clubs or  organizations: Not on file    Relationship status: Not on file  . Intimate partner violence:    Fear of current or ex partner: Not on file    Emotionally abused: Not on file    Physically abused: Not on file    Forced sexual activity: Not on file  Other Topics Concern  . Not on file  Social History Narrative  . Not on file    History reviewed. No pertinent family history.  Outpatient Encounter Medications as of 03/08/2018  Medication Sig  . predniSONE (DELTASONE) 10 MG tablet Take 10 mg by mouth daily.   No facility-administered encounter medications on file as of 03/08/2018.           Objective:   Physical Exam  Constitutional: He is oriented to person, place, and time. He appears well-developed and well-nourished.  HENT:  Head: Normocephalic and atraumatic.  Cardiovascular: Normal rate, regular rhythm and normal heart sounds.  Pulmonary/Chest: Effort normal and breath sounds normal.  Musculoskeletal:  Normal lumbar flexion and extension.  Some pain with rotation right and left but he was able to do full rotation.  Normal side bending.  Hip, knee, ankle strength is 5 out of 5 bilaterally.  1+ reflexes patellar bilaterally.  Tender over the coccyx.  Nontender over the lumbar spine.  Neurological: He is alert and oriented to person, place, and time.  Skin: Skin is warm and dry.  Psychiatric: He has a normal mood and affect. His behavior is normal.       Assessment & Plan:  Coccyx pain -recommend getting x-ray to evaluate for possible fracture especially since he has had pain for at least 4 months at this point in time.  And is not really getting better.  Recommend an anti-inflammatory for at least 5 days as well as icing as needed.  Will call with results of the x-ray.

## 2018-09-04 ENCOUNTER — Telehealth: Payer: Self-pay | Admitting: Family Medicine

## 2018-09-04 DIAGNOSIS — R7301 Impaired fasting glucose: Secondary | ICD-10-CM

## 2018-09-04 DIAGNOSIS — Z13220 Encounter for screening for lipoid disorders: Secondary | ICD-10-CM

## 2018-09-04 DIAGNOSIS — R7309 Other abnormal glucose: Secondary | ICD-10-CM

## 2018-09-04 DIAGNOSIS — Z0000 Encounter for general adult medical examination without abnormal findings: Secondary | ICD-10-CM

## 2018-09-04 DIAGNOSIS — Z130 Encounter for screening for diseases of the blood and blood-forming organs and certain disorders involving the immune mechanism: Secondary | ICD-10-CM

## 2018-09-04 DIAGNOSIS — E790 Hyperuricemia without signs of inflammatory arthritis and tophaceous disease: Secondary | ICD-10-CM

## 2018-09-04 DIAGNOSIS — R748 Abnormal levels of other serum enzymes: Secondary | ICD-10-CM

## 2018-09-04 NOTE — Telephone Encounter (Signed)
Pt wants labs done before annual exam. Pending.

## 2018-09-04 NOTE — Telephone Encounter (Signed)
Ordered

## 2018-09-04 NOTE — Telephone Encounter (Signed)
Pt advised.

## 2018-09-06 LAB — HEPATIC FUNCTION PANEL
AG RATIO: 1.2 (calc) (ref 1.0–2.5)
ALBUMIN MSPROF: 4.2 g/dL (ref 3.6–5.1)
ALKALINE PHOSPHATASE (APISO): 165 U/L — AB (ref 40–115)
ALT: 49 U/L — AB (ref 9–46)
AST: 42 U/L — AB (ref 10–40)
BILIRUBIN TOTAL: 0.6 mg/dL (ref 0.2–1.2)
Bilirubin, Direct: 0.1 mg/dL (ref 0.0–0.2)
Globulin: 3.6 g/dL (calc) (ref 1.9–3.7)
Indirect Bilirubin: 0.5 mg/dL (calc) (ref 0.2–1.2)
TOTAL PROTEIN: 7.8 g/dL (ref 6.1–8.1)

## 2018-09-06 LAB — BASIC METABOLIC PANEL
BUN: 17 mg/dL (ref 7–25)
CO2: 26 mmol/L (ref 20–32)
CREATININE: 1.24 mg/dL (ref 0.60–1.35)
Calcium: 9.5 mg/dL (ref 8.6–10.3)
Chloride: 105 mmol/L (ref 98–110)
Glucose, Bld: 95 mg/dL (ref 65–139)
Potassium: 4.5 mmol/L (ref 3.5–5.3)
Sodium: 136 mmol/L (ref 135–146)

## 2018-09-06 LAB — HEMOGLOBIN A1C
HEMOGLOBIN A1C: 6.3 %{Hb} — AB (ref <5.7)
Mean Plasma Glucose: 134 (calc)
eAG (mmol/L): 7.4 (calc)

## 2018-09-06 LAB — CBC
HEMATOCRIT: 43.3 % (ref 38.5–50.0)
Hemoglobin: 14.6 g/dL (ref 13.2–17.1)
MCH: 27.6 pg (ref 27.0–33.0)
MCHC: 33.7 g/dL (ref 32.0–36.0)
MCV: 81.9 fL (ref 80.0–100.0)
MPV: 11.9 fL (ref 7.5–12.5)
Platelets: 160 10*3/uL (ref 140–400)
RBC: 5.29 10*6/uL (ref 4.20–5.80)
RDW: 13.7 % (ref 11.0–15.0)
WBC: 3.8 10*3/uL (ref 3.8–10.8)

## 2018-09-06 LAB — LIPID PANEL
CHOLESTEROL: 162 mg/dL (ref <200)
HDL: 39 mg/dL — ABNORMAL LOW (ref >40)
LDL Cholesterol (Calc): 104 mg/dL (calc) — ABNORMAL HIGH
Non-HDL Cholesterol (Calc): 123 mg/dL (calc) (ref <130)
TRIGLYCERIDES: 97 mg/dL (ref <150)
Total CHOL/HDL Ratio: 4.2 (calc) (ref <5.0)

## 2018-09-06 LAB — URIC ACID: URIC ACID, SERUM: 8.7 mg/dL — AB (ref 4.0–8.0)

## 2018-09-10 ENCOUNTER — Ambulatory Visit (INDEPENDENT_AMBULATORY_CARE_PROVIDER_SITE_OTHER): Payer: 59 | Admitting: Family Medicine

## 2018-09-10 ENCOUNTER — Encounter: Payer: Self-pay | Admitting: Family Medicine

## 2018-09-10 VITALS — BP 130/78 | HR 68 | Ht 72.0 in | Wt 270.0 lb

## 2018-09-10 DIAGNOSIS — Z0000 Encounter for general adult medical examination without abnormal findings: Secondary | ICD-10-CM | POA: Diagnosis not present

## 2018-09-10 DIAGNOSIS — D869 Sarcoidosis, unspecified: Secondary | ICD-10-CM

## 2018-09-10 DIAGNOSIS — I10 Essential (primary) hypertension: Secondary | ICD-10-CM | POA: Diagnosis not present

## 2018-09-10 DIAGNOSIS — R740 Nonspecific elevation of levels of transaminase and lactic acid dehydrogenase [LDH]: Secondary | ICD-10-CM

## 2018-09-10 DIAGNOSIS — R7303 Prediabetes: Secondary | ICD-10-CM | POA: Diagnosis not present

## 2018-09-10 DIAGNOSIS — M1A0720 Idiopathic chronic gout, left ankle and foot, without tophus (tophi): Secondary | ICD-10-CM | POA: Diagnosis not present

## 2018-09-10 DIAGNOSIS — R7401 Elevation of levels of liver transaminase levels: Secondary | ICD-10-CM

## 2018-09-10 MED ORDER — COLCHICINE 0.6 MG PO TABS: 0.6000 mg | ORAL_TABLET | Freq: Every day | ORAL | 2 refills | Status: DC

## 2018-09-10 MED ORDER — ALLOPURINOL 300 MG PO TABS: 300.0000 mg | ORAL_TABLET | Freq: Every day | ORAL | 1 refills | Status: DC

## 2018-09-10 NOTE — Patient Instructions (Addendum)
Thank you for coming in today.  Set a calorie goal for 2000 calories a day.  You can enter the food that you eat in to a calorie log on your phone.  Myfitnesspal and LoseIt are good easy to use free apps.  Do not eat more than that with exercise.   Start allopurinol daily to lower uric acid for gout.  Start colchicine daily to prevent a gout attack.  Let me know if you have problem with getting the colchicine.    We will want to recheck labs in about 3 months.  Schedule a follow up visit with Dr Linford Arnold then.

## 2018-09-10 NOTE — Progress Notes (Signed)
Alex Odom is a 47 y.o. male who presents to [facility] Alex Odom: Primary Care Sports Medicine today for well adult visit.  Alex Odom typically sees Dr. Joneen Odom for his primary care needs however Alex Odom needs to have a physical completed before the end of October and she is not available.  Alex Odom notes that Alex Odom is doing reasonably well overall.  Alex Odom does have some pain in his left ankle Alex Odom attributes to gout.  Alex Odom is not currently taking any medications for gout.  In the past Alex Odom thinks Alex Odom is been on allopurinol and colchicine but not sure.  Exercises some but are regularly.  Alex Odom does not eat a careful diet.  Alex Odom is a history of sarcoidosis which is well controlled currently.  Alex Odom uses Qvar intermittently for flares not currently.  Alex Odom also uses prednisone every once in a while but not currently.  Alex Odom denies any fevers chills nausea vomiting or diarrhea chest pain or palpitations.   ROS as above:  Past Medical History:  Diagnosis Date  . Gout 12/31/2012  . Hypertension 12/31/2012  . Morbid obesity (HCC) 12/31/2012  . Nonischemic cardiomyopathy (HCC) 12/31/2012  . Prediabetes 08/30/2017  . Sarcoidosis 07/02/2008   Qualifier: Diagnosis of  By: Alex Arnold MD, Santina Evans     Past Surgical History:  Procedure Laterality Date  . NO PAST SURGERIES     Social History   Tobacco Use  . Smoking status: Never Smoker  . Smokeless tobacco: Never Used  Substance Use Topics  . Alcohol use: Yes   family history includes Diabetes in his mother and sister.  Medications: Current Outpatient Medications  Medication Sig Dispense Refill  . beclomethasone (QVAR) 80 MCG/ACT inhaler Inhale into the lungs.    Marland Kitchen allopurinol (ZYLOPRIM) 300 MG tablet Take 1 tablet (300 mg total) by mouth daily. For uric acid lowering 90 tablet 1  . colchicine 0.6 MG tablet Take 1 tablet (0.6 mg total) by mouth daily. To prevent gout flair while allopurinol is  starting 30 tablet 2   No current facility-administered medications for this visit.    No Known Allergies  Health Maintenance Health Maintenance  Topic Date Due  . INFLUENZA VACCINE  05/13/2019 (Originally 06/13/2018)  . TETANUS/TDAP  01/27/2027  . HIV Screening  Completed     Exam:  BP 130/78   Pulse 68   Ht 6' (1.829 m)   Wt 270 lb (122.5 kg)   BMI 36.62 kg/m  Wt Readings from Last 5 Encounters:  09/10/18 270 lb (122.5 kg)  08/30/17 255 lb (115.7 kg)  01/26/17 274 lb (124.3 kg)  12/18/14 273 lb (123.8 kg)  08/08/13 266 lb (120.7 kg)      Gen: Well NAD HEENT: EOMI,  MMM Lungs: Normal work of breathing. CTABL Heart: RRR no MRG Abd: NABS, Soft. Nondistended, Nontender Exts: Brisk capillary refill, warm and well perfused.  Psych: Alert and oriented normal speech thought process and affect.  Depression screen [facility] 2/9 09/11/2018 08/30/2017  Decreased Interest 0 0  Down, Depressed, Hopeless 0 0  PHQ - 2 Score 0 0     Chemistry      Component Value Date/Time   NA 136 09/05/2018 1407   NA 135 (A) 03/22/2016   K 4.5 09/05/2018 1407   CL 105 09/05/2018 1407   CO2 26 09/05/2018 1407   BUN 17 09/05/2018 1407   BUN 18 03/22/2016   CREATININE 1.24 09/05/2018 1407   GLU 82 03/22/2016  Component Value Date/Time   CALCIUM 9.5 09/05/2018 1407   ALKPHOS 152 (H) 01/26/2017 1551   AST 42 (H) 09/05/2018 1407   ALT 49 (H) 09/05/2018 1407   BILITOT 0.6 09/05/2018 1407     Lab Results  Component Value Date   CHOL 162 09/05/2018   HDL 39 (L) 09/05/2018   LDLCALC 104 (H) 09/05/2018   TRIG 97 09/05/2018   CHOLHDL 4.2 09/05/2018   Lab Results  Component Value Date   LABURIC 8.7 (H) 09/05/2018   Lab Results  Component Value Date   HGBA1C 6.3 (H) 09/05/2018     The 10-year ASCVD risk score Denman George DC Jr., et al., 2013) is: 5.3%   Values used to calculate the score:     Age: 73 years     Sex: Male     Is Non-Hispanic African American: Yes     Diabetic: No      Tobacco smoker: No     Systolic Blood Pressure: 130 mmHg     Is BP treated: No     HDL Cholesterol: 39 mg/dL     Total Cholesterol: 162 mg/dL   Lab and Radiology Results No results found for this or any previous visit (from the past 72 hour(s)). No results found.    Assessment and Plan: 47 y.o. male with  Well adult.  [name] has several issues that are outstanding identified during today's wellness visit.  The central medical problem is obesity.  We spent time discussing nutrition strategies and plan for 2000 -calorie/day diet.  This should result in 1 pound of weight loss per week.  Plan for food log using electronic app. Alex Odom will follow back up with PCP in about 3 months or sooner if needed.    Additionally Alex Odom has poorly controlled gout.  Uric acid elevated with occasional gout flares.  Plan to start allopurinol for uric acid lowering.  Will treat with colchicine for prophylaxis for the first 2 months as well.  Follow back up with PCP in 3 months likely will check uric acid level at that time as well.  Lipids are reasonably well controlled with an ASCVD risk score of less than 7.5.  Alex Odom has a history of nonischemic cardiomyopathy with a last echocardiogram 2014 showed ejection fraction of about 50%.  Plan to working on diet and weight and follow back up with PCP.  Alex Odom may benefit from recheck with cardiology in the future.  Consider restarting ACE inhibitors and beta-blockers however patient has clinically doing reasonably well.  Prediabetes A1c 6.3.  Significant risk for developing diabetes.  Patient with strong family history for diabetes.  Plan for reduced calorie diet and weight loss.  Follow back up with PCP in 3 months.  Transaminitis on recent labs.  Reduce calorie diet recheck 3 months.  No orders of the defined types were placed in this encounter.  Meds ordered this encounter  Medications  . allopurinol (ZYLOPRIM) 300 MG tablet    Sig: Take 1 tablet (300 mg total) by mouth  daily. For uric acid lowering    Dispense:  90 tablet    Refill:  1  . colchicine 0.6 MG tablet    Sig: Take 1 tablet (0.6 mg total) by mouth daily. To prevent gout flair while allopurinol is starting    Dispense:  30 tablet    Refill:  2     Discussed warning signs or symptoms. Please see discharge instructions. Patient expresses understanding.

## 2018-09-11 ENCOUNTER — Encounter: Payer: Self-pay | Admitting: Family Medicine

## 2018-09-12 ENCOUNTER — Encounter: Payer: 59 | Admitting: Family Medicine

## 2018-11-14 ENCOUNTER — Encounter: Payer: Self-pay | Admitting: *Deleted

## 2018-11-14 ENCOUNTER — Emergency Department (INDEPENDENT_AMBULATORY_CARE_PROVIDER_SITE_OTHER)
Admission: EM | Admit: 2018-11-14 | Discharge: 2018-11-14 | Disposition: A | Discharge status: Home / Self Care | Payer: 59 | Source: Home / Self Care | Attending: Family Medicine | Admitting: Family Medicine

## 2018-11-14 ENCOUNTER — Other Ambulatory Visit: Payer: Self-pay

## 2018-11-14 DIAGNOSIS — J029 Acute pharyngitis, unspecified: Secondary | ICD-10-CM

## 2018-11-14 LAB — POCT RAPID STREP A (OFFICE): Rapid Strep A Screen: NEGATIVE

## 2018-11-14 NOTE — Discharge Instructions (Signed)
°  Your rapid strep test was Negative today, however, a more detailed test known as a culture has been sent to the lab for more testing. This takes about 24-48 hours.  If the culture comes back Positive, we will let you know to start taking your antibiotic right away.  If it is negative, your symptoms are likely from a virus and should resolve within 1 week.  If you develop trouble breathing or swallowing liquids, please be re-evaluated by a medical provider.

## 2018-11-14 NOTE — ED Triage Notes (Signed)
Patient c/o 1 day of sore throat and congestion. Taken IBF and Aleve. Afebrile.

## 2018-11-14 NOTE — ED Provider Notes (Signed)
[facility]    CSN: [id number] Arrival date & time: 11/14/18  0900     History   Chief Complaint Chief Complaint  Patient presents with  . Sore Throat    HPI Alex Odom is a 48 y.o. male.   HPI NGHIA MASSER is a 48 y.o. male presenting to UC with c/o sore throat and nasal congestion for 1-2 days.  Pain is 8/10 at worst. He has taken ibuprofen and Aleve, aleve works better for pt. He is able to keep down fluids. Denies fever, chills, n/v/d.    Past Medical History:  Diagnosis Date  . Gout 12/31/2012  . Hypertension 12/31/2012  . Morbid obesity (HCC) 12/31/2012  . Nonischemic cardiomyopathy (HCC) 12/31/2012  . Prediabetes 08/30/2017  . Sarcoidosis 07/02/2008   Qualifier: Diagnosis of  By: Linford Arnold MD, Santina Evans      Patient Active Problem List   Diagnosis Date Noted  . Prediabetes 08/30/2017  . Hypertension 12/31/2012  . Gout 12/31/2012  . Nonischemic cardiomyopathy (HCC) 12/31/2012  . Morbid obesity (HCC) 12/31/2012  . Interstitial lung disease (HCC) 09/07/2011  . SARCOIDOSIS 07/02/2008  . Transaminitis 06/16/2008  . ERECTILE DYSFUNCTION 06/15/2008    Past Surgical History:  Procedure Laterality Date  . NO PAST SURGERIES         Home Medications    Prior to Admission medications   Medication Sig Start Date End Date Taking? Authorizing Provider  beclomethasone (QVAR) 80 MCG/ACT inhaler Inhale into the lungs. 02/01/17   [provider]    Family History Family History  Problem Relation Age of Onset  . Diabetes Mother   . Diabetes Sister     Social History Social History   Tobacco Use  . Smoking status: Never Smoker  . Smokeless tobacco: Never Used  Substance Use Topics  . Alcohol use: Yes  . Drug use: Never     Allergies   Patient has no known allergies.   Review of Systems Review of Systems  Constitutional: Negative for chills and fever.  HENT: Positive for congestion, postnasal drip and sore throat. Negative  for ear pain.   Respiratory: Positive for cough ( minimal).   Gastrointestinal: Negative for diarrhea, nausea and vomiting.  Neurological: Positive for dizziness and headaches. Negative for light-headedness.     Physical Exam Triage Vital Signs ED Triage Vitals [11/14/18 0928]  Enc Vitals Group     BP (!) 155/97     Pulse Rate 70     Resp 14     Temp 98.3 F (36.8 C)     Temp Source Oral     SpO2 97 %     Weight 276 lb (125.2 kg)     Height      Head Circumference      Peak Flow      Pain Score 4     Pain Loc      Pain Edu?      Excl. in GC?    No data found.  Updated Vital Signs BP (!) 155/97 (BP Location: Right Arm)   Pulse 70   Temp 98.3 F (36.8 C) (Oral)   Resp 14   Wt 276 lb (125.2 kg)   SpO2 97%   BMI 37.43 kg/m   Visual Acuity Right Eye Distance:   Left Eye Distance:   Bilateral Distance:    Right Eye Near:   Left Eye Near:    Bilateral Near:     Physical Exam Vitals signs and nursing  note reviewed.  Constitutional:      Appearance: He is well-developed.  HENT:     Head: Normocephalic and atraumatic.     Right Ear: Tympanic membrane normal.     Left Ear: Tympanic membrane normal.     Nose: Nose normal.     Right Sinus: No maxillary sinus tenderness or frontal sinus tenderness.     Left Sinus: No maxillary sinus tenderness or frontal sinus tenderness.     Mouth/Throat:     Lips: Pink.     Mouth: Mucous membranes are moist.     Pharynx: Uvula midline. Pharyngeal swelling and posterior oropharyngeal erythema present. No oropharyngeal exudate or uvula swelling.  Neck:     Musculoskeletal: Normal range of motion and neck supple.  Cardiovascular:     Rate and Rhythm: Normal rate and regular rhythm.  Pulmonary:     Effort: Pulmonary effort is normal.     Breath sounds: Normal breath sounds. No stridor. No wheezing or rhonchi.  Musculoskeletal: Normal range of motion.  Lymphadenopathy:     Cervical: No cervical adenopathy.  Skin:    General:  Skin is warm and dry.     Findings: No rash.  Neurological:     Mental Status: He is alert and oriented to person, place, and time.  Psychiatric:        Behavior: Behavior normal.      UC Treatments / Results  Labs (all labs ordered are listed, but only abnormal results are displayed) Labs Reviewed  STREP A DNA PROBE  POCT RAPID STREP A (OFFICE)    EKG None  Radiology No results found.  Procedures Procedures (including critical care time)  Medications Ordered in UC Medications - No data to display  Initial Impression / Assessment and Plan / UC Course  I have reviewed the triage vital signs and the nursing notes.  Pertinent labs & imaging results that were available during my care of the patient were reviewed by me and considered in my medical decision making (see chart for details).     Rapid strep: NEGATIVE Culture sent Home care info provided.  Final Clinical Impressions(s) / UC Diagnoses   Final diagnoses:  Sore throat  Acute pharyngitis, unspecified etiology     Discharge Instructions      Your rapid strep test was Negative today, however, a more detailed test known as a culture has been sent to the lab for more testing. This takes about 24-48 hours.  If the culture comes back Positive, we will let you know to start taking your antibiotic right away.  If it is negative, your symptoms are likely from a virus and should resolve within 1 week.  If you develop trouble breathing or swallowing liquids, please be re-evaluated by a medical provider.       ED Prescriptions    None     Controlled Substance Prescriptions [place] Controlled Substance Registry consulted? Not Applicable   Rolla Plate 11/14/18 1950

## 2018-11-15 LAB — STREP A DNA PROBE: Group A Strep Probe: NOT DETECTED

## 2018-12-12 ENCOUNTER — Ambulatory Visit: Payer: 59 | Admitting: Family Medicine

## 2018-12-13 ENCOUNTER — Ambulatory Visit: Payer: 59 | Admitting: Family Medicine

## 2019-02-17 ENCOUNTER — Telehealth: Payer: Self-pay

## 2019-02-17 NOTE — Telephone Encounter (Signed)
Alex Odom called and left a message stating he has jock itch. I called to get him scheduled for an appointment. He is working and is unable to do a visit. I asked him if he could do a virtual visit. I gave him the information to sign back into MyChart with [facility]. He will call back if he has any problems. He is going to do an after hours E-visit.

## 2019-02-18 ENCOUNTER — Telehealth (INDEPENDENT_AMBULATORY_CARE_PROVIDER_SITE_OTHER): Payer: 59 | Admitting: Family Medicine

## 2019-02-18 ENCOUNTER — Encounter: Payer: Self-pay | Admitting: Family Medicine

## 2019-02-18 ENCOUNTER — Other Ambulatory Visit: Payer: Self-pay

## 2019-02-18 VITALS — Ht 72.0 in | Wt 258.0 lb

## 2019-02-18 DIAGNOSIS — B356 Tinea cruris: Secondary | ICD-10-CM | POA: Diagnosis not present

## 2019-02-18 MED ORDER — TERBINAFINE HCL 1 % EX CREA: 1.0000 "application " | TOPICAL_CREAM | Freq: Two times a day (BID) | CUTANEOUS | 0 refills | Status: DC

## 2019-02-18 NOTE — Progress Notes (Signed)
Virtual Visit via Telephone Note  I connected with Alex Odom on 02/18/19 at  4:20 PM EDT by telephone and verified that I am speaking with the correct person using two identifiers.   I discussed the limitations, risks, security and privacy concerns of performing an evaluation and management service by telephone and the availability of in person appointments. I also discussed with the patient that there may be a patient responsible charge related to this service. The patient expressed understanding and agreed to proceed.  Subjective:    CC: groin rash  HPI:  Spoke w/pt he reports that this has been going on for 3 mos and had been using cortisone 10 cream and this didn't help. He didn't try anything else. This is in his groin area. He has been trying to keep the area dry as he possibly can after he showers/bathes. The area is itchy. . He did change soaps (irish spring, or he may have used an old razor when he shaved) that caused this he does remember doing this around time. No bumps or drainage. No ever had this before.   Past medical history, Surgical history, Family history not pertinant except as noted below, Social history, Allergies, and medications have been entered into the medical record, reviewed, and corrections made.   Review of Systems: No fevers, chills, night sweats, weight loss, chest pain, or shortness of breath.   Objective:    General: Speaking clearly in complete sentences without any shortness of breath.  Alert and oriented x3.  Normal judgment. No apparent acute distress.    Impression and Recommendations:    Groin Rash - likely tinea versicolor based on description.  Will tx with antifungal.  Rx sent for TErbinafine.  Call if not better in one week.     I discussed the assessment and treatment plan with the patient. The patient was provided an opportunity to ask questions and all were answered. The patient agreed with the plan and demonstrated an understanding of  the instructions.   The patient was advised to call back or seek an in-person evaluation if the symptoms worsen or if the condition fails to improve as anticipated.   Nani Gasser, MD

## 2019-02-18 NOTE — Telephone Encounter (Signed)
Scheduled

## 2019-02-18 NOTE — Progress Notes (Signed)
Spoke w/pt he reports that this has been going on for 3 mos and had been using cortisone 10 cream and this didn't help. He didn't try anything else. This is in his groin area. He has been trying to keep the area dry as he possibly can after he showers/bathes. The area is itchy and bumpy. He did change soaps (irish spring, or he may have used an old razor when he shaved) that caused this he does remember doing this around time.Heath Gold, CMA

## 2019-06-26 ENCOUNTER — Encounter: Payer: Self-pay | Admitting: Family Medicine

## 2019-06-26 ENCOUNTER — Other Ambulatory Visit: Payer: Self-pay

## 2019-06-26 ENCOUNTER — Ambulatory Visit (INDEPENDENT_AMBULATORY_CARE_PROVIDER_SITE_OTHER): Payer: 59 | Admitting: Family Medicine

## 2019-06-26 ENCOUNTER — Telehealth: Payer: Self-pay | Admitting: Family Medicine

## 2019-06-26 VITALS — BP 135/77 | HR 74 | Ht 72.0 in | Wt 280.0 lb

## 2019-06-26 DIAGNOSIS — R300 Dysuria: Secondary | ICD-10-CM

## 2019-06-26 DIAGNOSIS — N401 Enlarged prostate with lower urinary tract symptoms: Secondary | ICD-10-CM | POA: Diagnosis not present

## 2019-06-26 DIAGNOSIS — R351 Nocturia: Secondary | ICD-10-CM

## 2019-06-26 DIAGNOSIS — N529 Male erectile dysfunction, unspecified: Secondary | ICD-10-CM | POA: Insufficient documentation

## 2019-06-26 DIAGNOSIS — R7303 Prediabetes: Secondary | ICD-10-CM

## 2019-06-26 DIAGNOSIS — R800 Isolated proteinuria: Secondary | ICD-10-CM

## 2019-06-26 DIAGNOSIS — I10 Essential (primary) hypertension: Secondary | ICD-10-CM

## 2019-06-26 LAB — POCT URINALYSIS DIPSTICK
Bilirubin, UA: NEGATIVE
Blood, UA: NEGATIVE
Glucose, UA: NEGATIVE
Ketones, UA: NEGATIVE
Leukocytes, UA: NEGATIVE
Nitrite, UA: NEGATIVE
Protein, UA: POSITIVE — AB
Spec Grav, UA: 1.02 (ref 1.010–1.025)
Urobilinogen, UA: 0.2 E.U./dL
pH, UA: 7 (ref 5.0–8.0)

## 2019-06-26 LAB — POCT UA - MICROALBUMIN
Creatinine, POC: 300 mg/dL
Microalbumin Ur, POC: 150 mg/L

## 2019-06-26 LAB — POCT GLYCOSYLATED HEMOGLOBIN (HGB A1C): Hemoglobin A1C: 5.9 % — AB (ref 4.0–5.6)

## 2019-06-26 MED ORDER — PHENAZOPYRIDINE HCL 200 MG PO TABS: 200.0000 mg | ORAL_TABLET | Freq: Three times a day (TID) | ORAL | 0 refills | Status: DC | PRN

## 2019-06-26 NOTE — Assessment & Plan Note (Signed)
A1C much improved.  Great work F/U in 6 mo

## 2019-06-26 NOTE — Assessment & Plan Note (Signed)
ED score of 12, which is significant for ED symptoms.  Could consider ED medications. Testosterone questionnaire is significant so will check testosterone levels.  There were about 2 years ago per his report.  Lab Results  Component Value Date   TESTOSTERONE 403.72 03/26/2009

## 2019-06-26 NOTE — Progress Notes (Signed)
He reports that his sxs began ~1 month ago.burning w/urination. Denies f/s/c/n/v/d. No blood notice when urinating. He has had some low back pain 3/10.   Pt has hx of prediabetes. Will check a1c last result was 6.3 on 09/05/2018. He admits to having increased thirst. .Alex Odom, [place]

## 2019-06-26 NOTE — Assessment & Plan Note (Signed)
Urine microalbuminuria.  Mild amt. Plan to recheck kidney function.

## 2019-06-26 NOTE — Assessment & Plan Note (Addendum)
New  Dx.  Prostate gland was enlarged on exam today no bogginess or asymmetry or nodules.  AUA score of 11.  5 points for urinary frequency and 3 points for having to push or strain to begin urination, and 3 points for nocturia.  Rates his quality of life is 4 which is mostly dissatisfied.  Discussed treatment options with medication.

## 2019-06-26 NOTE — Telephone Encounter (Signed)
Can we call lab and see if can add BMP

## 2019-06-26 NOTE — Progress Notes (Signed)
Established Patient Office Visit  Subjective:  Patient ID: Alex BandaDavid L Odom, male    DOB: [birth year]  Age: 48 y.o. MRN: [id number]  CC:  Chief Complaint  Patient presents with  . Dysuria    HPI Alex Odom presents for   He reports that his sxs began ~1 month ago with burning w/urination.  He denies any hematuria.  He denies any pelvic pain nausea or vomiting.  He denies any back pain with it.  He is never had problems with a bladder infection or prostate symptoms.  His wife is here with him today.  He does complain of an odor to his urine that he is noticed on and off. Denies f/s/c/n/v/d. No blood notice when urinating. He has had some low back pain 3/10.  He is also noticed he is been getting up to urinate 2-3 times a night.  Though he says that has actually been going on since last fall.  He is also concerned because he is been noticing some more difficulty with maintaining erection and delaying orgasm.   Pt has hx of prediabetes. Will check a1c last result was 6.3 on 09/05/2018. He admits to having increased thirst.  Past Medical History:  Diagnosis Date  . Gout 12/31/2012  . Hypertension 12/31/2012  . Morbid obesity (HCC) 12/31/2012  . Nonischemic cardiomyopathy (HCC) 12/31/2012  . Prediabetes 08/30/2017  . Sarcoidosis 07/02/2008   Qualifier: Diagnosis of  By: Linford ArnoldMetheney MD, Santina Evansatherine      Past Surgical History:  Procedure Laterality Date  . NO PAST SURGERIES      Family History  Problem Relation Age of Onset  . Diabetes Mother   . Diabetes Sister     Social History   Socioeconomic History  . Marital status: Married    Spouse name: Not on file  . Number of children: Not on file  . Years of education: Not on file  . Highest education level: Not on file  Occupational History  . Not on file  Social Needs  . Financial resource strain: Not on file  . Food insecurity    Worry: Not on file    Inability: Not on file  . Transportation needs    Medical: Not on file     Non-medical: Not on file  Tobacco Use  . Smoking status: Never Smoker  . Smokeless tobacco: Never Used  Substance and Sexual Activity  . Alcohol use: Yes  . Drug use: Never  . Sexual activity: Yes  Lifestyle  . Physical activity    Days per week: Not on file    Minutes per session: Not on file  . Stress: Not on file  Relationships  . Social Musicianconnections    Talks on phone: Not on file    Gets together: Not on file    Attends religious service: Not on file    Active member of club or organization: Not on file    Attends meetings of clubs or organizations: Not on file    Relationship status: Not on file  . Intimate partner violence    Fear of current or ex partner: Not on file    Emotionally abused: Not on file    Physically abused: Not on file    Forced sexual activity: Not on file  Other Topics Concern  . Not on file  Social History Narrative  . Not on file    Outpatient Medications Prior to Visit  Medication Sig Dispense Refill  . beclomethasone (QVAR) 80 MCG/ACT  inhaler Inhale into the lungs.    . terbinafine (LAMISIL) 1 % cream Apply 1 application topically 2 (two) times daily. X 4 weeks. 30 g 0   No facility-administered medications prior to visit.     No Known Allergies  ROS Review of Systems    Objective:    Physical Exam  Constitutional: He is oriented to person, place, and time. He appears well-developed and well-nourished.  HENT:  Head: Normocephalic and atraumatic.  Eyes: Conjunctivae and EOM are normal.  Cardiovascular: Normal rate.  Pulmonary/Chest: Effort normal.  Genitourinary:    Rectum normal.  Rectum:     Guaiac result negative.     No rectal mass, tenderness, external hemorrhoid or abnormal anal tone.  Prostate is enlarged. Prostate is not tender.  Neurological: He is alert and oriented to person, place, and time.  Skin: Skin is dry. No pallor.  Psychiatric: He has a normal mood and affect. His behavior is normal.  Vitals  reviewed.   BP 135/77   Pulse 74   Ht 6' (1.829 m)   Wt 280 lb (127 kg)   SpO2 97%   BMI 37.97 kg/m  Wt Readings from Last 3 Encounters:  06/26/19 280 lb (127 kg)  02/18/19 258 lb (117 kg)  11/14/18 276 lb (125.2 kg)     Health Maintenance Due  Topic Date Due  . INFLUENZA VACCINE  06/14/2019    There are no preventive care reminders to display for this patient.  Lab Results  Component Value Date   TSH 2.599 03/26/2009   Lab Results  Component Value Date   WBC 3.8 09/05/2018   HGB 14.6 09/05/2018   HCT 43.3 09/05/2018   MCV 81.9 09/05/2018   PLT 160 09/05/2018   Lab Results  Component Value Date   NA 136 09/05/2018   K 4.5 09/05/2018   CO2 26 09/05/2018   GLUCOSE 95 09/05/2018   BUN 17 09/05/2018   CREATININE 1.24 09/05/2018   BILITOT 0.6 09/05/2018   ALKPHOS 152 (H) 01/26/2017   AST 42 (H) 09/05/2018   ALT 49 (H) 09/05/2018   PROT 7.8 09/05/2018   ALBUMIN 3.6 01/26/2017   CALCIUM 9.5 09/05/2018   Lab Results  Component Value Date   CHOL 162 09/05/2018   Lab Results  Component Value Date   HDL 39 (L) 09/05/2018   Lab Results  Component Value Date   LDLCALC 104 (H) 09/05/2018   Lab Results  Component Value Date   TRIG 97 09/05/2018   Lab Results  Component Value Date   CHOLHDL 4.2 09/05/2018   Lab Results  Component Value Date   HGBA1C 5.9 (A) 06/26/2019      Assessment & Plan:   Problem List Items Addressed This Visit      Other   Prediabetes   Relevant Orders   POCT glycosylated hemoglobin (Hb A1C) (Completed)   PSA   CBC   Testosterone Total,Free,Bio, Males   Follicle stimulating hormone   Luteinizing hormone   POCT UA - Microalbumin (Completed)   Isolated proteinuria without specific morphologic lesion    Urine microalbuminuria.  Mild amt. Plan to recheck kidney function.        Erectile dysfunction    ED score of 12, which is significant for ED symptoms.  Could consider ED medications. Testosterone questionnaire is  significant so will check testosterone levels.  There were about 2 years ago per his report.  Lab Results  Component Value Date   TESTOSTERONE 403.72 03/26/2009  Relevant Orders   PSA   CBC   Testosterone Total,Free,Bio, Males   Follicle stimulating hormone   Luteinizing hormone   Benign prostatic hyperplasia with nocturia    New  Dx.  Prostate gland was enlarged on exam today no bogginess or asymmetry or nodules.  AUA score of 11.  5 points for urinary frequency and 3 points for having to push or strain to begin urination, and 3 points for nocturia.  Rates his quality of life is 4 which is mostly dissatisfied.  Discussed treatment options with medication.      Relevant Orders   PSA   CBC   Testosterone Total,Free,Bio, Males   Follicle stimulating hormone   Luteinizing hormone    Other Visit Diagnoses    Dysuria    -  Primary   Relevant Orders   POCT urinalysis dipstick (Completed)   PSA   CBC   Testosterone Total,Free,Bio, Males   Follicle stimulating hormone   Luteinizing hormone   Urine Culture     Dysuria - UA is negative except for trace protein.  Will send for culture. Will try AZO.  Not sure an infection. Prostatitis seems less likely.      Meds ordered this encounter  Medications  . phenazopyridine (PYRIDIUM) 200 MG tablet    Sig: Take 1 tablet (200 mg total) by mouth 3 (three) times daily as needed for pain.    Dispense:  30 tablet    Refill:  0    Follow-up: No follow-ups on file.    Nani Gasseratherine Alexande Sheerin, MD

## 2019-06-27 NOTE — Telephone Encounter (Signed)
Spoke with Hannah Beat at [facility], requested this be added on

## 2019-06-27 NOTE — Telephone Encounter (Signed)
Per [facility], orders were placed after 5 and they weren't drawn yesterday. I called pt and advised him to go Monday to have labs done. I added lab for BMP so it can be drawn on Monday also

## 2019-06-28 LAB — URINE CULTURE
MICRO NUMBER:: 768680
Result:: NO GROWTH
SPECIMEN QUALITY:: ADEQUATE

## 2019-07-01 LAB — CBC
HCT: 46.2 % (ref 38.5–50.0)
Hemoglobin: 14.9 g/dL (ref 13.2–17.1)
MCH: 27.2 pg (ref 27.0–33.0)
MCHC: 32.3 g/dL (ref 32.0–36.0)
MCV: 84.5 fL (ref 80.0–100.0)
MPV: 12.2 fL (ref 7.5–12.5)
Platelets: 168 10*3/uL (ref 140–400)
RBC: 5.47 10*6/uL (ref 4.20–5.80)
RDW: 14.3 % (ref 11.0–15.0)
WBC: 5 10*3/uL (ref 3.8–10.8)

## 2019-07-01 LAB — TESTOSTERONE TOTAL,FREE,BIO, MALES
Albumin: 4 g/dL (ref 3.6–5.1)
Sex Hormone Binding: 43 nmol/L (ref 10–50)
Testosterone, Bioavailable: 63.4 ng/dL — ABNORMAL LOW (ref >=110.0)
Testosterone, Free: 34.4 pg/mL — ABNORMAL LOW (ref 46.0–224.0)
Testosterone: 328 ng/dL (ref 250–827)

## 2019-07-01 LAB — FOLLICLE STIMULATING HORMONE: FSH: 2.3 m[IU]/mL (ref 1.6–8.0)

## 2019-07-01 LAB — LUTEINIZING HORMONE: LH: 1.8 m[IU]/mL (ref 1.5–9.3)

## 2019-07-01 LAB — PSA: PSA: 0.5 ng/mL (ref <=4.0)

## 2019-07-02 ENCOUNTER — Telehealth: Payer: Self-pay

## 2019-07-02 DIAGNOSIS — N342 Other urethritis: Secondary | ICD-10-CM

## 2019-07-02 DIAGNOSIS — R7301 Impaired fasting glucose: Secondary | ICD-10-CM

## 2019-07-02 MED ORDER — AZITHROMYCIN 1 G PO PACK: 1.0000 g | PACK | Freq: Once | ORAL | 0 refills | Status: AC

## 2019-07-02 NOTE — Telephone Encounter (Signed)
Okay, I think at this point we do need to rule out STDs so I do recommend he come by and do a urine sample.  It would be a dirty urine with a full bladder.  To check for gonorrhea and chlamydia.  If he can drop that off today or tomorrow morning that would be perfect and then a medical head and send over an antibiotic for him to try but I do want him to start it until he is able drop off the urine sample.

## 2019-07-02 NOTE — Telephone Encounter (Signed)
Patient advised, will drop off urine tomorrow. Aware not to start antibiotic until urine sample has been given

## 2019-07-02 NOTE — Telephone Encounter (Signed)
Patient called today to let provider know that he is still having painful urination and other urinary SX. Patient states that he and his wife had intercourse on Saturday and his wife is now having SX also....  Does patient need another appt with you to discuss this or can he have some additional testing ordered? He also completed blood work yesterday and wanted Dr Madilyn Fireman to review this.

## 2019-07-03 ENCOUNTER — Telehealth: Payer: Self-pay

## 2019-07-03 NOTE — Telephone Encounter (Signed)
Shaymus called and states the power form of the antibiotic is $70. He would prefer the tablet. Please advise.

## 2019-07-04 LAB — C. TRACHOMATIS/N. GONORRHOEAE RNA
C. trachomatis RNA, TMA: NOT DETECTED
N. gonorrhoeae RNA, TMA: NOT DETECTED

## 2019-07-04 MED ORDER — AZITHROMYCIN 500 MG PO TABS: 1000.0000 mg | ORAL_TABLET | Freq: Once | ORAL | 0 refills | Status: AC

## 2019-07-04 NOTE — Telephone Encounter (Signed)
Done . Pt notified.  

## 2019-07-07 ENCOUNTER — Telehealth: Payer: Self-pay

## 2019-07-07 MED ORDER — CIPROFLOXACIN HCL 500 MG PO TABS: 500.0000 mg | ORAL_TABLET | Freq: Two times a day (BID) | ORAL | 0 refills | Status: DC

## 2019-07-07 NOTE — Telephone Encounter (Signed)
Pt advised..Mohan Erven Lynetta, CMA  

## 2019-07-07 NOTE — Telephone Encounter (Signed)
Garo states he is 95% better. He is still having slight burning with urination. He is requesting another antibiotic. I did tell him about the result note of a referral to urology. He would rather have another antibiotic. Please advise.

## 2019-07-07 NOTE — Telephone Encounter (Signed)
Okay, prescription sent to [facility]. 

## 2019-07-24 ENCOUNTER — Telehealth: Payer: Self-pay

## 2019-07-24 DIAGNOSIS — R7989 Other specified abnormal findings of blood chemistry: Secondary | ICD-10-CM

## 2019-07-24 NOTE — Telephone Encounter (Signed)
Per last labs:  "Your testosterone levels are low. I would recommend that we repeat those levels in 2 to 3 weeks to confirm. If they are still low then we can discuss testosterone replacement therapy if he would like. I do not think this is related to his symptoms but certainly could be related to some of the other concerns that he mentioned during the office visit. Additional hormone levels are normal."  Testosterone ordered, pt advised to have drawn at 8 AM

## 2019-08-01 ENCOUNTER — Other Ambulatory Visit: Payer: Self-pay | Admitting: Family Medicine

## 2019-08-01 LAB — TESTOSTERONE TOTAL,FREE,BIO, MALES
Albumin: 4 g/dL (ref 3.6–5.1)
Sex Hormone Binding: 42 nmol/L (ref 10–50)
Testosterone, Bioavailable: 75.4 ng/dL — ABNORMAL LOW (ref >=110.0)
Testosterone, Free: 41 pg/mL — ABNORMAL LOW (ref 46.0–224.0)
Testosterone: 377 ng/dL (ref 250–827)

## 2019-08-11 ENCOUNTER — Other Ambulatory Visit: Payer: Self-pay

## 2019-08-11 ENCOUNTER — Encounter: Payer: Self-pay | Admitting: Family Medicine

## 2019-08-11 ENCOUNTER — Ambulatory Visit (INDEPENDENT_AMBULATORY_CARE_PROVIDER_SITE_OTHER): Payer: 59 | Admitting: Family Medicine

## 2019-08-11 VITALS — BP 137/81 | HR 63 | Ht 72.0 in | Wt 280.0 lb

## 2019-08-11 DIAGNOSIS — Z23 Encounter for immunization: Secondary | ICD-10-CM | POA: Diagnosis not present

## 2019-08-11 DIAGNOSIS — N529 Male erectile dysfunction, unspecified: Secondary | ICD-10-CM | POA: Diagnosis not present

## 2019-08-11 DIAGNOSIS — R7989 Other specified abnormal findings of blood chemistry: Secondary | ICD-10-CM | POA: Diagnosis not present

## 2019-08-11 DIAGNOSIS — R7301 Impaired fasting glucose: Secondary | ICD-10-CM | POA: Diagnosis not present

## 2019-08-11 MED ORDER — TESTOSTERONE 20.25 MG/1.25GM (1.62%) TD GEL: 2.0000 | TRANSDERMAL | 2 refills | Status: DC

## 2019-08-11 NOTE — Assessment & Plan Note (Signed)
Discussed possibly a trial of Viagra or Cialis.  He wants to give the testosterone ago for a month or 2 first and see if that helps before starting additional medication.

## 2019-08-11 NOTE — Progress Notes (Signed)
Established Patient Office Visit  Subjective:  Patient ID: Alex Odom, male    DOB: 03/24/1971  Age: 48 y.o. MRN: [id number]  CC:  Chief Complaint  Patient presents with  . Results    testosterone    HPI Alex Odom presents to go over recent low confirming low testosterone.  A month ago total testosterone was 328, 11 days ago was 377.  But his bioavailable testosterone was low at 63 months ago and low at 75, 11 days ago.  Interestingly he also had a low total testosterone actually by 11 years ago.  Did encourage him to schedule an appointment if he wanted to go over those results and discuss possible treatment options.  He does complain of low energy, difficulty maintaining muscle mass, change in sleep quality and some difficulty with erectile dysfunction.  Impaired fasting glucose-no increased thirst or urination. No symptoms consistent with hypoglycemia.  Also he had not had an A1c checked in a most a year.  We rechecked it in August and it was just mildly elevated at 5.9 but lower than it was a year ago.  He also like to consider medication for erectile dysfunction particularly.  Lab Results  Component Value Date   HGBA1C 5.9 (A) 06/26/2019     Past Medical History:  Diagnosis Date  . Gout 12/31/2012  . Hypertension 12/31/2012  . Morbid obesity (HCC) 12/31/2012  . Nonischemic cardiomyopathy (HCC) 12/31/2012  . Prediabetes 08/30/2017  . Sarcoidosis 07/02/2008   Qualifier: Diagnosis of  By: Linford ArnoldMetheney MD, Santina Evansatherine      Past Surgical History:  Procedure Laterality Date  . NO PAST SURGERIES      Family History  Problem Relation Age of Onset  . Diabetes Mother   . Diabetes Sister     Social History   Socioeconomic History  . Marital status: Married    Spouse name: Not on file  . Number of children: Not on file  . Years of education: Not on file  . Highest education level: Not on file  Occupational History  . Not on file  Social Needs  . Financial resource  strain: Not on file  . Food insecurity    Worry: Not on file    Inability: Not on file  . Transportation needs    Medical: Not on file    Non-medical: Not on file  Tobacco Use  . Smoking status: Never Smoker  . Smokeless tobacco: Never Used  Substance and Sexual Activity  . Alcohol use: Yes  . Drug use: Never  . Sexual activity: Yes  Lifestyle  . Physical activity    Days per week: Not on file    Minutes per session: Not on file  . Stress: Not on file  Relationships  . Social Musicianconnections    Talks on phone: Not on file    Gets together: Not on file    Attends religious service: Not on file    Active member of club or organization: Not on file    Attends meetings of clubs or organizations: Not on file    Relationship status: Not on file  . Intimate partner violence    Fear of current or ex partner: Not on file    Emotionally abused: Not on file    Physically abused: Not on file    Forced sexual activity: Not on file  Other Topics Concern  . Not on file  Social History Narrative  . Not on file    Outpatient  Medications Prior to Visit  Medication Sig Dispense Refill  . phenazopyridine (PYRIDIUM) 200 MG tablet Take 1 tablet (200 mg total) by mouth 3 (three) times daily as needed for pain. 30 tablet 0   No facility-administered medications prior to visit.     No Known Allergies  ROS Review of Systems    Objective:    Physical Exam  Constitutional: He is oriented to person, place, and time. He appears well-developed and well-nourished.  HENT:  Head: Normocephalic and atraumatic.  Eyes: Conjunctivae and EOM are normal.  Cardiovascular: Normal rate.  Pulmonary/Chest: Effort normal.  Neurological: He is alert and oriented to person, place, and time.  Skin: Skin is dry. No pallor.  Psychiatric: He has a normal mood and affect. His behavior is normal.  Vitals reviewed.   BP 137/81   Pulse 63   Ht 6' (1.829 m)   Wt 280 lb (127 kg)   SpO2 98%   BMI 37.97 kg/m   Wt Readings from Last 3 Encounters:  08/11/19 280 lb (127 kg)  06/26/19 280 lb (127 kg)  02/18/19 258 lb (117 kg)     Health Maintenance Due  Topic Date Due  . INFLUENZA VACCINE  06/14/2019    There are no preventive care reminders to display for this patient.  Lab Results  Component Value Date   TSH 2.599 03/26/2009   Lab Results  Component Value Date   WBC 5.0 06/30/2019   HGB 14.9 06/30/2019   HCT 46.2 06/30/2019   MCV 84.5 06/30/2019   PLT 168 06/30/2019   Lab Results  Component Value Date   NA 136 09/05/2018   K 4.5 09/05/2018   CO2 26 09/05/2018   GLUCOSE 95 09/05/2018   BUN 17 09/05/2018   CREATININE 1.24 09/05/2018   BILITOT 0.6 09/05/2018   ALKPHOS 152 (H) 01/26/2017   AST 42 (H) 09/05/2018   ALT 49 (H) 09/05/2018   PROT 7.8 09/05/2018   ALBUMIN 3.6 01/26/2017   CALCIUM 9.5 09/05/2018   Lab Results  Component Value Date   CHOL 162 09/05/2018   Lab Results  Component Value Date   HDL 39 (L) 09/05/2018   Lab Results  Component Value Date   LDLCALC 104 (H) 09/05/2018   Lab Results  Component Value Date   TRIG 97 09/05/2018   Lab Results  Component Value Date   CHOLHDL 4.2 09/05/2018   Lab Results  Component Value Date   HGBA1C 5.9 (A) 06/26/2019      Assessment & Plan:   Problem List Items Addressed This Visit      Endocrine   IFG (impaired fasting glucose) - Primary    Continue work on healthy diet and regular exercise.        Other   Low testosterone in male    New Dx. Discussed potential options for testosterone replacement.  We also discussed potential risk factors including increased risk for heart disease and stroke and the fact that we will need to monitor PSA for elevation and possible prostate cancer.  He would like to move forward with a topical gel. F/U in 8 weeks, labs in 6 weeks.       Relevant Medications   Testosterone (ANDROGEL) 20.25 MG/1.25GM (1.62%) GEL   Erectile dysfunction    Discussed possibly a trial  of Viagra or Cialis.  He wants to give the testosterone ago for a month or 2 first and see if that helps before starting additional medication.  Other Visit Diagnoses    Need for immunization against influenza       Relevant Orders   Flu Vaccine QUAD 36+ mos IM (Completed)      Meds ordered this encounter  Medications  . Testosterone (ANDROGEL) 20.25 MG/1.25GM (1.62%) GEL    Sig: Place 2 Act onto the skin every morning.    Dispense:  1.25 g    Refill:  2    Ok to sub generic please    Follow-up: Return in about 2 months (around 10/11/2019) for New start medication.    Beatrice Lecher, MD

## 2019-08-11 NOTE — Patient Instructions (Signed)
Will need to go to the lab in 6 weeks about 2-4 hours after application of the gel.

## 2019-08-11 NOTE — Assessment & Plan Note (Addendum)
New Dx. Discussed potential options for testosterone replacement.  We also discussed potential risk factors including increased risk for heart disease and stroke and the fact that we will need to monitor PSA for elevation and possible prostate cancer.  He would like to move forward with a topical gel. F/U in 8 weeks, labs in 6 weeks.

## 2019-08-11 NOTE — Assessment & Plan Note (Signed)
Continue work on healthy diet and regular exercise. 

## 2019-08-14 DIAGNOSIS — R0600 Dyspnea, unspecified: Secondary | ICD-10-CM | POA: Insufficient documentation

## 2019-08-18 ENCOUNTER — Telehealth: Payer: Self-pay | Admitting: Family Medicine

## 2019-08-18 NOTE — Telephone Encounter (Signed)
He needs to call his insurance and find out what is covered.  Formulations including gels, patches and the injectable in the office.

## 2019-08-18 NOTE — Telephone Encounter (Signed)
I called the patient insurance and there is not an option to to have authorization on the Gel. The gel is not covered and I tried to get alterative and per [facility] she would not give me what was covered for this patient. I have let the patient know that he may have to try and injectable and he understands this may be an option. Do you have any other recommendations? Please advise.

## 2019-08-19 NOTE — Telephone Encounter (Signed)
Patient will reach out to his insurance and find out what is covered.

## 2019-08-20 NOTE — Telephone Encounter (Signed)
Pt scheduled for new start testosterone NV next week.

## 2019-08-20 NOTE — Telephone Encounter (Signed)
Per patient insurance the only covered medication is an injectable testosterone. He is willing to come for a nurse visit to start. When does he need to start and what dose will he be on? Please advise.

## 2019-08-20 NOTE — Telephone Encounter (Signed)
Can start anytime.  Recommendation is for 200 mg every 2 weeks.

## 2019-08-28 ENCOUNTER — Ambulatory Visit (INDEPENDENT_AMBULATORY_CARE_PROVIDER_SITE_OTHER): Payer: 59 | Admitting: Family Medicine

## 2019-08-28 ENCOUNTER — Other Ambulatory Visit: Payer: Self-pay

## 2019-08-28 VITALS — BP 124/58 | HR 66 | Wt 286.0 lb

## 2019-08-28 DIAGNOSIS — E291 Testicular hypofunction: Secondary | ICD-10-CM

## 2019-08-28 MED ORDER — TESTOSTERONE CYPIONATE 200 MG/ML IM SOLN
200.0000 mg | Freq: Once | INTRAMUSCULAR | Status: AC
  Administered 2019-08-28: 200 mg via INTRAMUSCULAR

## 2019-08-28 NOTE — Progress Notes (Signed)
Established Patient Office Visit  Subjective:  Patient ID: Alex Odom, male    DOB: 1971-05-08  Age: 48 y.o. MRN: [id number]  CC:  Chief Complaint  Patient presents with  . Hypogonadism    HPI .Alex Odom is here for first testosterone injection. Denies chest pain, shortness of breath, headaches or mood changes.   Past Medical History:  Diagnosis Date  . Gout 12/31/2012  . Hypertension 12/31/2012  . Morbid obesity ([place]) 12/31/2012  . Nonischemic cardiomyopathy ([place]) 12/31/2012  . Prediabetes 08/30/2017  . Sarcoidosis 07/02/2008   Qualifier: Diagnosis of  By: Madilyn Fireman MD, Barnetta Chapel      Past Surgical History:  Procedure Laterality Date  . NO PAST SURGERIES      Family History  Problem Relation Age of Onset  . Diabetes Mother   . Diabetes Sister     Social History   Socioeconomic History  . Marital status: Married    Spouse name: Not on file  . Number of children: Not on file  . Years of education: Not on file  . Highest education level: Not on file  Occupational History  . Not on file  Social Needs  . Financial resource strain: Not on file  . Food insecurity    Worry: Not on file    Inability: Not on file  . Transportation needs    Medical: Not on file    Non-medical: Not on file  Tobacco Use  . Smoking status: Never Smoker  . Smokeless tobacco: Never Used  Substance and Sexual Activity  . Alcohol use: Yes  . Drug use: Never  . Sexual activity: Yes  Lifestyle  . Physical activity    Days per week: Not on file    Minutes per session: Not on file  . Stress: Not on file  Relationships  . Social Herbalist on phone: Not on file    Gets together: Not on file    Attends religious service: Not on file    Active member of club or organization: Not on file    Attends meetings of clubs or organizations: Not on file    Relationship status: Not on file  . Intimate partner violence    Fear of current or ex partner: Not on file     Emotionally abused: Not on file    Physically abused: Not on file    Forced sexual activity: Not on file  Other Topics Concern  . Not on file  Social History Narrative  . Not on file    Outpatient Medications Prior to Visit  Medication Sig Dispense Refill  . TESTOSTERONE CYPIONATE IM Inject 200 mg into the muscle every 14 (fourteen) days.    . Testosterone (ANDROGEL) 20.25 MG/1.25GM (1.62%) GEL Place 2 Act onto the skin every morning. 1.25 g 2   No facility-administered medications prior to visit.     No Known Allergies  ROS Review of Systems    Objective:    Physical Exam  BP (!) 143/86   Pulse 66   Wt 286 lb (129.7 kg)   SpO2 97%   BMI 38.79 kg/m  Wt Readings from Last 3 Encounters:  08/28/19 286 lb (129.7 kg)  08/11/19 280 lb (127 kg)  06/26/19 280 lb (127 kg)     There are no preventive care reminders to display for this patient.  There are no preventive care reminders to display for this patient.  Lab Results  Component Value Date  TSH 2.599 03/26/2009   Lab Results  Component Value Date   WBC 5.0 06/30/2019   HGB 14.9 06/30/2019   HCT 46.2 06/30/2019   MCV 84.5 06/30/2019   PLT 168 06/30/2019   Lab Results  Component Value Date   NA 136 09/05/2018   K 4.5 09/05/2018   CO2 26 09/05/2018   GLUCOSE 95 09/05/2018   BUN 17 09/05/2018   CREATININE 1.24 09/05/2018   BILITOT 0.6 09/05/2018   ALKPHOS 152 (H) 01/26/2017   AST 42 (H) 09/05/2018   ALT 49 (H) 09/05/2018   PROT 7.8 09/05/2018   ALBUMIN 3.6 01/26/2017   CALCIUM 9.5 09/05/2018   Lab Results  Component Value Date   CHOL 162 09/05/2018   Lab Results  Component Value Date   HDL 39 (L) 09/05/2018   Lab Results  Component Value Date   LDLCALC 104 (H) 09/05/2018   Lab Results  Component Value Date   TRIG 97 09/05/2018   Lab Results  Component Value Date   CHOLHDL 4.2 09/05/2018   Lab Results  Component Value Date   HGBA1C 5.9 (A) 06/26/2019      Assessment & Plan:   Hypogonadism - Patient tolerated injection well without complications. Patient advised to schedule next injection 14 days from today.    Problem List Items Addressed This Visit    None    Visit Diagnoses    Hypogonadism in male    -  Primary   Relevant Medications   testosterone cypionate (DEPOTESTOSTERONE CYPIONATE) injection 200 mg (Completed)      Meds ordered this encounter  Medications  . testosterone cypionate (DEPOTESTOSTERONE CYPIONATE) injection 200 mg    Follow-up: Return in about 2 weeks (around 09/11/2019) for testosterone injection.Earna Coder, Janalyn Harder, CMA

## 2019-08-28 NOTE — Progress Notes (Signed)
Agree with documentation as above.   Catherine Metheney, MD  

## 2019-09-11 ENCOUNTER — Ambulatory Visit: Payer: 59

## 2019-09-17 ENCOUNTER — Telehealth: Payer: Self-pay

## 2019-09-17 MED ORDER — "NEEDLE (REUSABLE) 22G X 1-1/2"" MISC": 2 refills | Status: DC

## 2019-09-17 MED ORDER — INDOMETHACIN 50 MG PO CAPS: 50.0000 mg | ORAL_CAPSULE | Freq: Two times a day (BID) | ORAL | 0 refills | Status: DC

## 2019-09-17 MED ORDER — TESTOSTERONE CYPIONATE 200 MG/ML IM SOLN: 200.0000 mg | INTRAMUSCULAR | 0 refills | Status: DC

## 2019-09-17 MED ORDER — "SYRINGE 18G X 1-1/2"" 3 ML MISC": 0 refills | Status: DC

## 2019-09-17 NOTE — Telephone Encounter (Signed)
Patient advised of new prescription.   He also would like to start giving himself the testosterone injections. Patient advised he will need to schedule a nurse visit for teaching after he picks up the testosterone and supplies.

## 2019-09-17 NOTE — Telephone Encounter (Signed)
OK, I will send in indomethacin instead.

## 2019-09-17 NOTE — Telephone Encounter (Signed)
Rolondo called and left a message stating he has a gout flare up. He was unable to pick up the medication Dr Georgina Snell sent in a year ago due to the cost. Looks like he was prescribed Colchicine and Allopurinol. It was probably the Colchicine that was expensive. Does patient need an appointment?

## 2019-09-17 NOTE — Telephone Encounter (Signed)
OK, sounds good. 

## 2019-09-18 ENCOUNTER — Ambulatory Visit: Payer: 59

## 2019-09-29 ENCOUNTER — Ambulatory Visit: Payer: 59 | Admitting: Family Medicine

## 2019-09-29 ENCOUNTER — Telehealth: Payer: Self-pay | Admitting: Family Medicine

## 2019-09-29 NOTE — Progress Notes (Deleted)
Acute Office Visit  Subjective:    Patient ID: Alex Odom, male    DOB: [birth year], 48 y.o.   MRN: [id number]  No chief complaint on file.   HPI Patient is in today for burning with urination.   Past Medical History:  Diagnosis Date  . Gout 12/31/2012  . Hypertension 12/31/2012  . Morbid obesity ([place]) 12/31/2012  . Nonischemic cardiomyopathy ([place]) 12/31/2012  . Prediabetes 08/30/2017  . Sarcoidosis 07/02/2008   Qualifier: Diagnosis of  By: Madilyn Fireman MD, Barnetta Chapel      Past Surgical History:  Procedure Laterality Date  . NO PAST SURGERIES      Family History  Problem Relation Age of Onset  . Diabetes Mother   . Diabetes Sister     Social History   Socioeconomic History  . Marital status: Married    Spouse name: Not on file  . Number of children: Not on file  . Years of education: Not on file  . Highest education level: Not on file  Occupational History  . Not on file  Social Needs  . Financial resource strain: Not on file  . Food insecurity    Worry: Not on file    Inability: Not on file  . Transportation needs    Medical: Not on file    Non-medical: Not on file  Tobacco Use  . Smoking status: Never Smoker  . Smokeless tobacco: Never Used  Substance and Sexual Activity  . Alcohol use: Yes  . Drug use: Never  . Sexual activity: Yes  Lifestyle  . Physical activity    Days per week: Not on file    Minutes per session: Not on file  . Stress: Not on file  Relationships  . Social Herbalist on phone: Not on file    Gets together: Not on file    Attends religious service: Not on file    Active member of club or organization: Not on file    Attends meetings of clubs or organizations: Not on file    Relationship status: Not on file  . Intimate partner violence    Fear of current or ex partner: Not on file    Emotionally abused: Not on file    Physically abused: Not on file    Forced sexual activity: Not on file  Other Topics Concern  . Not  on file  Social History Narrative  . Not on file    Outpatient Medications Prior to Visit  Medication Sig Dispense Refill  . indomethacin (INDOCIN) 50 MG capsule Take 1 capsule (50 mg total) by mouth 2 (two) times daily with a meal. As needed for gout flare. 60 capsule 0  . NEEDLE, REUSABLE, 22 G 22G X 1-1/2" MISC Inject testosterone into muscle every 14 days. 12 each 2  . Syringe/Needle, Disp, (SYRINGE 3CC/18GX1-1/2") 18G X 1-1/2" 3 ML MISC Inject testosterone into muscle every 14 days. 100 each 0  . testosterone cypionate (DEPOTESTOSTERONE CYPIONATE) 200 MG/ML injection Inject 1 mL (200 mg total) into the muscle every 14 (fourteen) days. 6 mL 0  . TESTOSTERONE CYPIONATE IM Inject 200 mg into the muscle every 14 (fourteen) days.     No facility-administered medications prior to visit.     No Known Allergies  ROS     Objective:    Physical Exam  There were no vitals taken for this visit. Wt Readings from Last 3 Encounters:  08/28/19 286 lb (129.7 kg)  08/11/19 280 lb (127  kg)  06/26/19 280 lb (127 kg)    There are no preventive care reminders to display for this patient.  There are no preventive care reminders to display for this patient.   Lab Results  Component Value Date   TSH 2.599 03/26/2009   Lab Results  Component Value Date   WBC 5.0 06/30/2019   HGB 14.9 06/30/2019   HCT 46.2 06/30/2019   MCV 84.5 06/30/2019   PLT 168 06/30/2019   Lab Results  Component Value Date   NA 136 09/05/2018   K 4.5 09/05/2018   CO2 26 09/05/2018   GLUCOSE 95 09/05/2018   BUN 17 09/05/2018   CREATININE 1.24 09/05/2018   BILITOT 0.6 09/05/2018   ALKPHOS 152 (H) 01/26/2017   AST 42 (H) 09/05/2018   ALT 49 (H) 09/05/2018   PROT 7.8 09/05/2018   ALBUMIN 3.6 01/26/2017   CALCIUM 9.5 09/05/2018   Lab Results  Component Value Date   CHOL 162 09/05/2018   Lab Results  Component Value Date   HDL 39 (L) 09/05/2018   Lab Results  Component Value Date   LDLCALC 104 (H)  09/05/2018   Lab Results  Component Value Date   TRIG 97 09/05/2018   Lab Results  Component Value Date   CHOLHDL 4.2 09/05/2018   Lab Results  Component Value Date   HGBA1C 5.9 (A) 06/26/2019       Assessment & Plan:   Problem List Items Addressed This Visit    None       No orders of the defined types were placed in this encounter.    Nani Gasser, MD

## 2019-09-29 NOTE — Telephone Encounter (Signed)
Patient called at 11:59 am to cancel his 2:20 pm appointment. FYI.

## 2019-10-13 ENCOUNTER — Ambulatory Visit: Payer: 59 | Admitting: Family Medicine

## 2019-10-13 NOTE — Progress Notes (Deleted)
Established Patient Office Visit  Subjective:  Patient ID: Alex Odom, male    DOB: [birth year]  Age: 48 y.o. MRN: [id number]  CC: No chief complaint on file.   HPI Alex Odom presents for new start testosterone replacement.  It is now been 8 weeks.  Initially we had decided on doing the gel but then he wanted to switch to the injections he came into the office and wanted to be able to do them on his own at home.  We had scheduled him an appointment so that he could learn to do them on his own at home but patient canceled the appointment.  Past Medical History:  Diagnosis Date  . Gout 12/31/2012  . Hypertension 12/31/2012  . Morbid obesity ([facility]) 12/31/2012  . Nonischemic cardiomyopathy ([facility]) 12/31/2012  . Prediabetes 08/30/2017  . Sarcoidosis 07/02/2008   Qualifier: Diagnosis of  By: Linford Arnold MD, Santina Evans      Past Surgical History:  Procedure Laterality Date  . NO PAST SURGERIES      Family History  Problem Relation Age of Onset  . Diabetes Mother   . Diabetes Sister     Social History   Socioeconomic History  . Marital status: Married    Spouse name: Not on file  . Number of children: Not on file  . Years of education: Not on file  . Highest education level: Not on file  Occupational History  . Not on file  Social Needs  . Financial resource strain: Not on file  . Food insecurity    Worry: Not on file    Inability: Not on file  . Transportation needs    Medical: Not on file    Non-medical: Not on file  Tobacco Use  . Smoking status: Never Smoker  . Smokeless tobacco: Never Used  Substance and Sexual Activity  . Alcohol use: Yes  . Drug use: Never  . Sexual activity: Yes  Lifestyle  . Physical activity    Days per week: Not on file    Minutes per session: Not on file  . Stress: Not on file  Relationships  . Social Musician on phone: Not on file    Gets together: Not on file    Attends religious service: Not on file    Active member  of club or organization: Not on file    Attends meetings of clubs or organizations: Not on file    Relationship status: Not on file  . Intimate partner violence    Fear of current or ex partner: Not on file    Emotionally abused: Not on file    Physically abused: Not on file    Forced sexual activity: Not on file  Other Topics Concern  . Not on file  Social History Narrative  . Not on file    Outpatient Medications Prior to Visit  Medication Sig Dispense Refill  . indomethacin (INDOCIN) 50 MG capsule Take 1 capsule (50 mg total) by mouth 2 (two) times daily with a meal. As needed for gout flare. 60 capsule 0  . NEEDLE, REUSABLE, 22 G 22G X 1-1/2" MISC Inject testosterone into muscle every 14 days. 12 each 2  . Syringe/Needle, Disp, (SYRINGE 3CC/18GX1-1/2") 18G X 1-1/2" 3 ML MISC Inject testosterone into muscle every 14 days. 100 each 0  . testosterone cypionate (DEPOTESTOSTERONE CYPIONATE) 200 MG/ML injection Inject 1 mL (200 mg total) into the muscle every 14 (fourteen) days. 6 mL 0  .  TESTOSTERONE CYPIONATE IM Inject 200 mg into the muscle every 14 (fourteen) days.     No facility-administered medications prior to visit.     No Known Allergies  ROS Review of Systems    Objective:    Physical Exam  There were no vitals taken for this visit. Wt Readings from Last 3 Encounters:  08/28/19 286 lb (129.7 kg)  08/11/19 280 lb (127 kg)  06/26/19 280 lb (127 kg)     There are no preventive care reminders to display for this patient.  There are no preventive care reminders to display for this patient.  Lab Results  Component Value Date   TSH 2.599 03/26/2009   Lab Results  Component Value Date   WBC 5.0 06/30/2019   HGB 14.9 06/30/2019   HCT 46.2 06/30/2019   MCV 84.5 06/30/2019   PLT 168 06/30/2019   Lab Results  Component Value Date   NA 136 09/05/2018   K 4.5 09/05/2018   CO2 26 09/05/2018   GLUCOSE 95 09/05/2018   BUN 17 09/05/2018   CREATININE 1.24  09/05/2018   BILITOT 0.6 09/05/2018   ALKPHOS 152 (H) 01/26/2017   AST 42 (H) 09/05/2018   ALT 49 (H) 09/05/2018   PROT 7.8 09/05/2018   ALBUMIN 3.6 01/26/2017   CALCIUM 9.5 09/05/2018   Lab Results  Component Value Date   CHOL 162 09/05/2018   Lab Results  Component Value Date   HDL 39 (L) 09/05/2018   Lab Results  Component Value Date   LDLCALC 104 (H) 09/05/2018   Lab Results  Component Value Date   TRIG 97 09/05/2018   Lab Results  Component Value Date   CHOLHDL 4.2 09/05/2018   Lab Results  Component Value Date   HGBA1C 5.9 (A) 06/26/2019      Assessment & Plan:   Problem List Items Addressed This Visit    None      No orders of the defined types were placed in this encounter.   Follow-up: No follow-ups on file.    Beatrice Lecher, MD

## 2019-10-15 ENCOUNTER — Ambulatory Visit: Payer: 59

## 2019-10-24 ENCOUNTER — Other Ambulatory Visit: Payer: Self-pay

## 2019-10-24 ENCOUNTER — Ambulatory Visit (INDEPENDENT_AMBULATORY_CARE_PROVIDER_SITE_OTHER): Payer: 59 | Admitting: Family Medicine

## 2019-10-24 VITALS — BP 134/74 | HR 80 | Ht 72.0 in | Wt 286.0 lb

## 2019-10-24 DIAGNOSIS — E291 Testicular hypofunction: Secondary | ICD-10-CM

## 2019-10-24 MED ORDER — TESTOSTERONE CYPIONATE 200 MG/ML IM SOLN
200.0000 mg | Freq: Once | INTRAMUSCULAR | Status: AC
  Administered 2019-10-24: 200 mg via INTRAMUSCULAR

## 2019-10-24 NOTE — Progress Notes (Signed)
Patient presents for testosterone teaching. He and his wife were told on how to give the medication as an injection. I went through the instructions with patient and he nor the wife had any questions.   Patient supplied the medication and his wife was show on the demonstration skin that was I the lab first. Then the wife took the medication and gave the injection to the patient. The patient tolerated the injection well in the [facility] with no immediate complications.    I gave both of them the handout on giving testosterone and they did not have any questions. They were advised to call back with any questions.

## 2019-10-24 NOTE — Progress Notes (Signed)
Agree with documentation as above.   Aydeen Blume, MD  

## 2019-11-05 ENCOUNTER — Ambulatory Visit: Payer: 59

## 2019-11-27 IMAGING — DX DG SACRUM/COCCYX 2+V
3 series · 3 of 3 positions shown · non-contrast
Comparison: CT abdomen and pelvis 06/26/2008.

CLINICAL DATA: Coccygeal pain since November 2017. No known injury.

EXAM:
SACRUM AND COCCYX - 2+ VIEW

[coccyx ap]
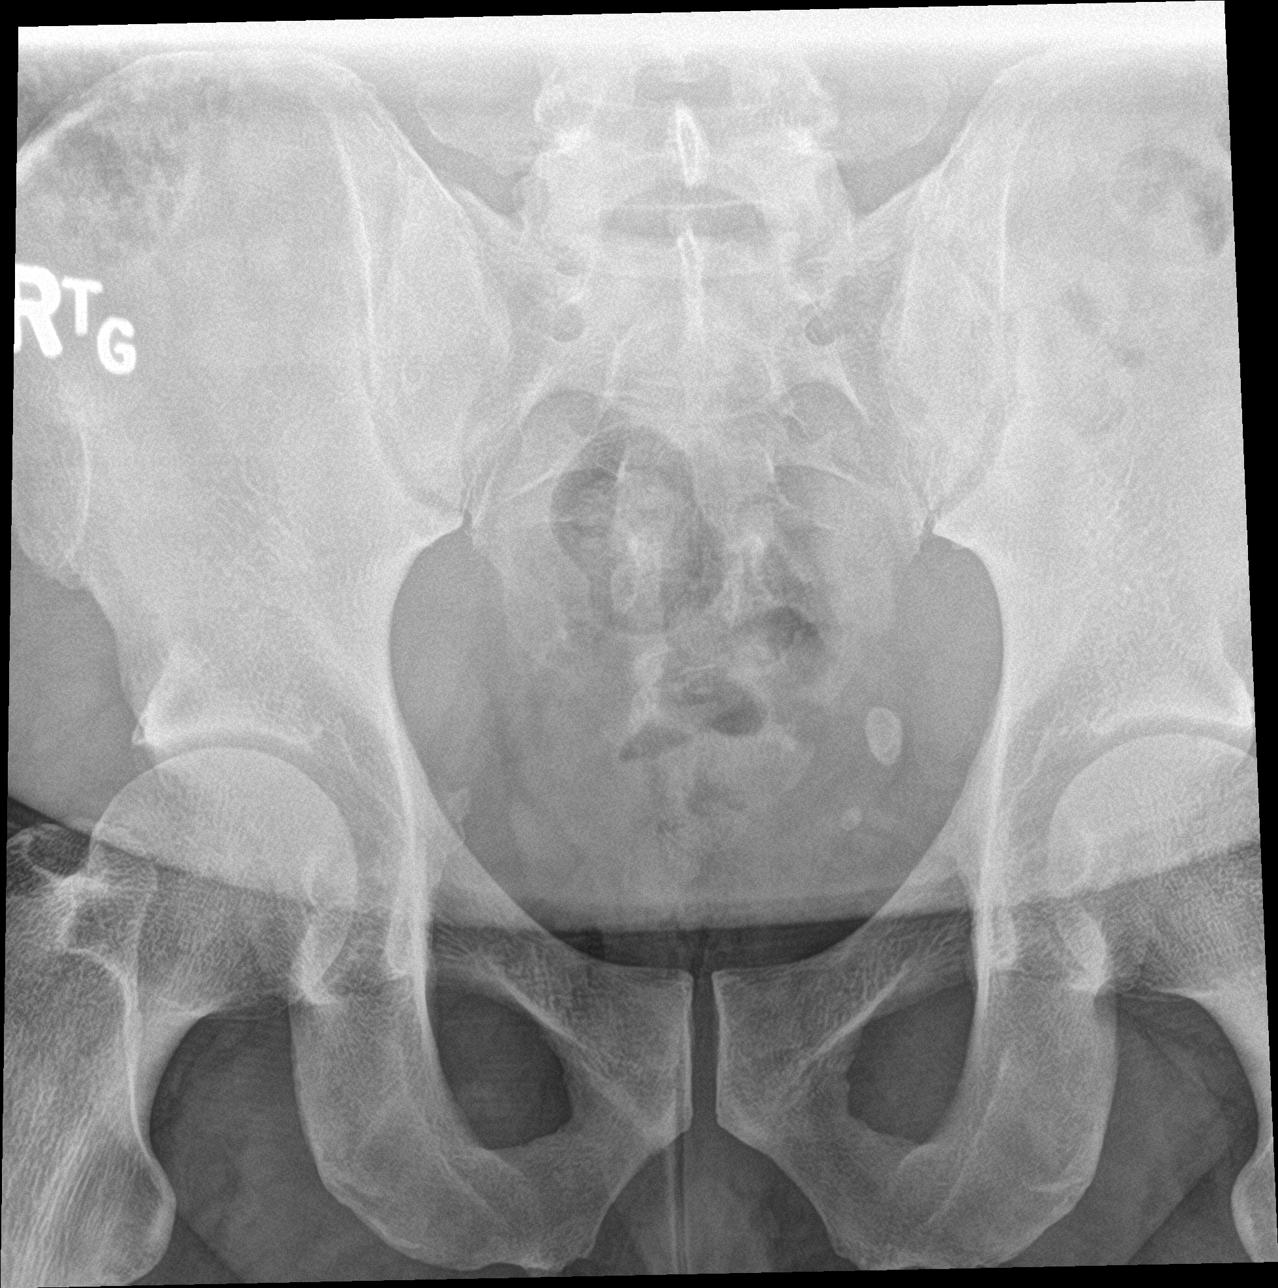

[sacrum ap]
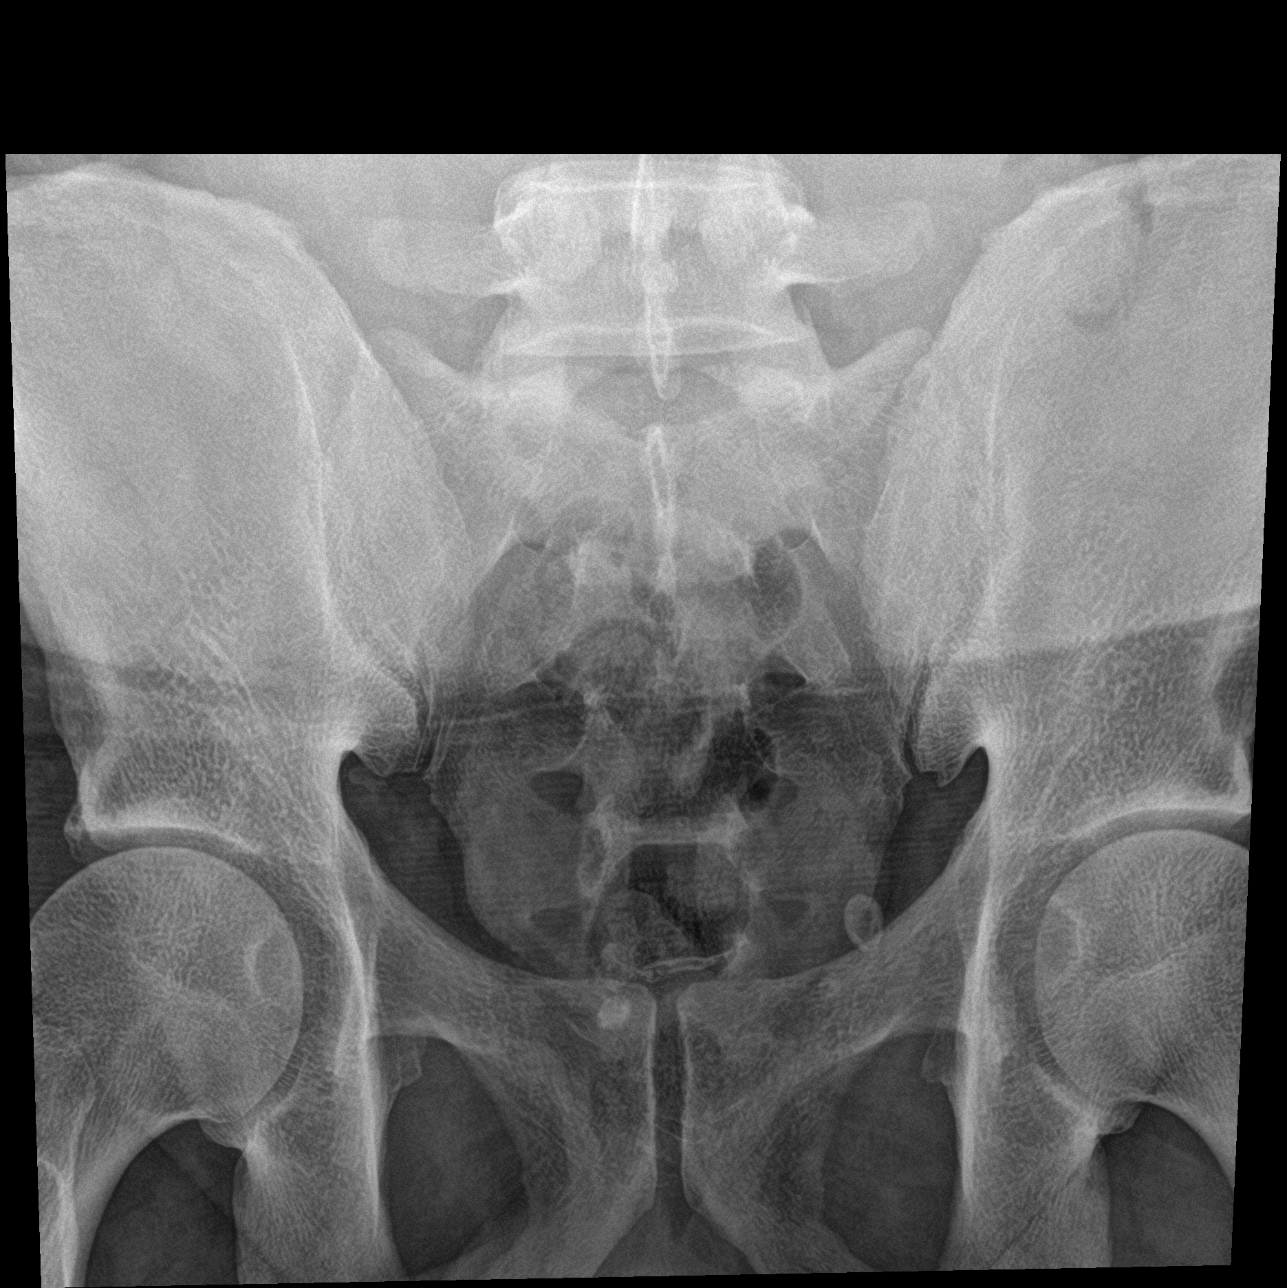

[sacrum lat]
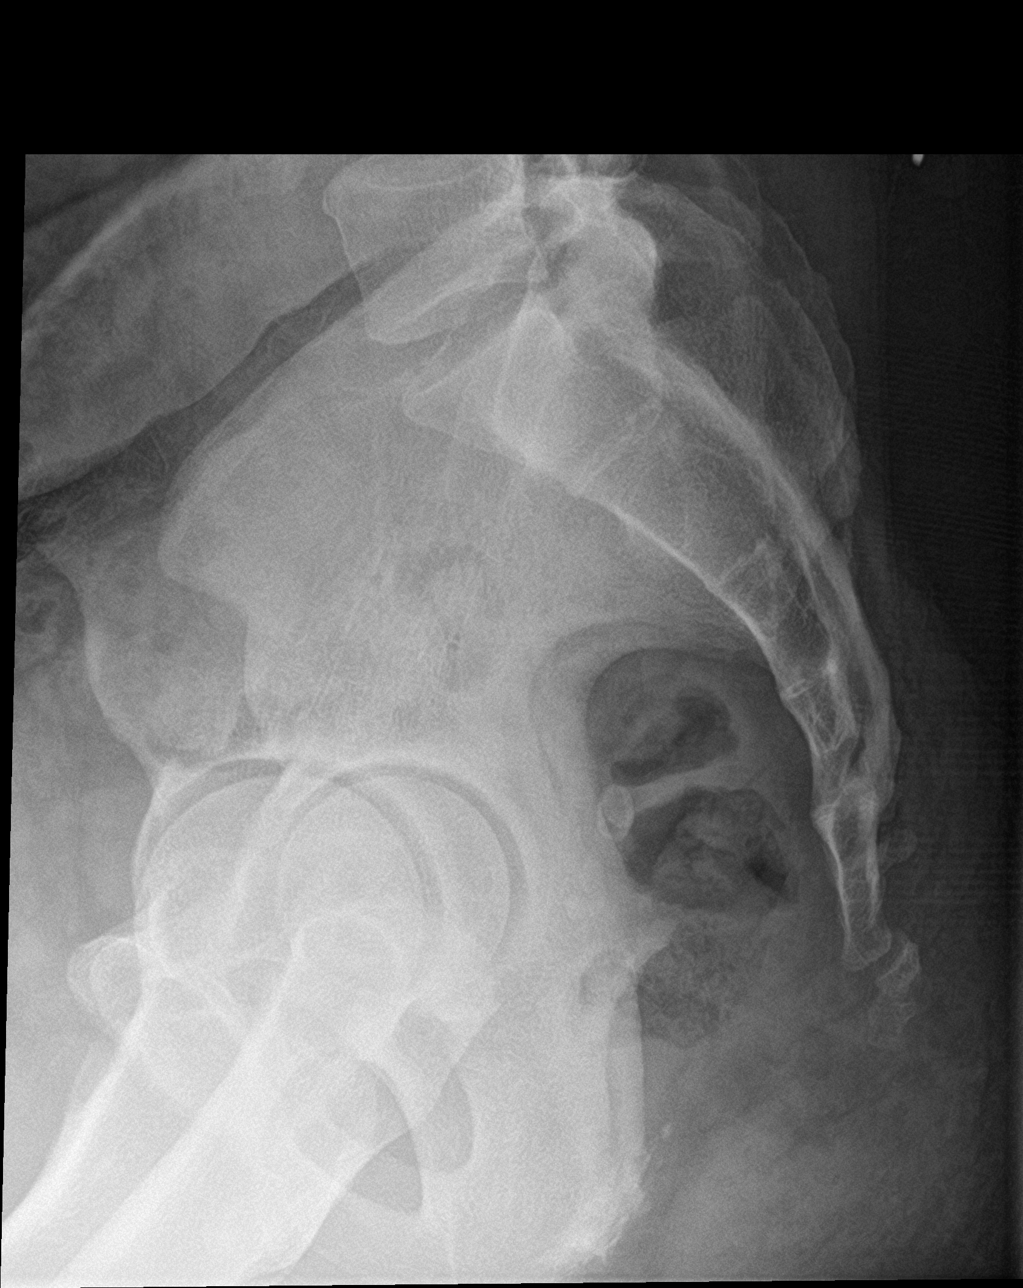

[3 of 3 positions shown; findings below may reference images not displayed]

FINDINGS: Widening inter coccygeal joint and slight dorsal displacement of the
inferior coccyx are new since the prior examination. No fracture is
seen.
IMPRESSION: Findings as described above are new since the prior CT scan and
suggestive of a hypermobile coccyx which can be a source of pain.

Negative for fracture.

## 2020-02-03 ENCOUNTER — Telehealth: Payer: Self-pay

## 2020-02-03 NOTE — Telephone Encounter (Signed)
Alex Odom states he is having a sarcoidosis flare up. He would like a refill on prednisone. Should we schedule him for a follow up? Please advise.

## 2020-02-04 NOTE — Telephone Encounter (Signed)
Patient has been scheduled

## 2020-02-04 NOTE — Telephone Encounter (Signed)
Yes, but see if we can get him on the schedule tomorrow or Friday

## 2020-02-05 ENCOUNTER — Ambulatory Visit: Payer: 59 | Admitting: Family Medicine

## 2020-05-24 DIAGNOSIS — Z1211 Encounter for screening for malignant neoplasm of colon: Secondary | ICD-10-CM | POA: Insufficient documentation

## 2020-05-24 LAB — HM COLONOSCOPY

## 2020-06-03 ENCOUNTER — Encounter: Payer: Self-pay | Admitting: Family Medicine

## 2020-06-03 ENCOUNTER — Ambulatory Visit (INDEPENDENT_AMBULATORY_CARE_PROVIDER_SITE_OTHER): Payer: 59 | Admitting: Family Medicine

## 2020-06-03 VITALS — BP 144/81 | HR 81 | Ht 72.0 in | Wt 289.0 lb

## 2020-06-03 DIAGNOSIS — R0600 Dyspnea, unspecified: Secondary | ICD-10-CM | POA: Diagnosis not present

## 2020-06-03 DIAGNOSIS — R05 Cough: Secondary | ICD-10-CM | POA: Diagnosis not present

## 2020-06-03 DIAGNOSIS — R059 Cough, unspecified: Secondary | ICD-10-CM

## 2020-06-03 DIAGNOSIS — D869 Sarcoidosis, unspecified: Secondary | ICD-10-CM | POA: Diagnosis not present

## 2020-06-03 DIAGNOSIS — R0602 Shortness of breath: Secondary | ICD-10-CM

## 2020-06-03 MED ORDER — DOXYCYCLINE HYCLATE 100 MG PO TABS: 100.0000 mg | ORAL_TABLET | Freq: Two times a day (BID) | ORAL | 0 refills | Status: DC

## 2020-06-03 MED ORDER — PREDNISONE 20 MG PO TABS: ORAL_TABLET | ORAL | 0 refills | Status: AC

## 2020-06-03 NOTE — Assessment & Plan Note (Signed)
I think he is having having a flare with his sarcoid.  He has an increase in sputum production but it still mostly clear or white.  No significant fevers or chills.  So we will treat with taper of prednisone over the next month.  His appointment with pulmonology has not until October but hopefully he can get something a little sooner.  Continue with Qvar twice a day.  If he is not improving over the weekend then I want him to give [place] a call back so we can consider chest x-ray and a CBC with differential and further work-up.  Did send over prescription for doxycycline just in case he feels like over the weekend he is getting worse, notices a change in sputum production, or develops a low-grade temperature.

## 2020-06-03 NOTE — Progress Notes (Signed)
Established Patient Office Visit  Subjective:  Patient ID: Alex Odom, male    DOB: 19-Jan-1971  Age: 49 y.o. MRN: [id number]  CC:  Chief Complaint  Patient presents with  . Hypertension  . Shortness of Breath    HPI Alex Odom presents for cough and shortness of breath it has been going on for about 3 weeks.  He says back in the spring when the pollen hit may be April or May he developed cough and shortness of breath with his underlying sarcoidosis.  They gave him a steroid burst and it seemed to help and knock it out.  But this time its been going on for about 3 weeks.  He denies any change in color of sputum but he has had increased production.  No sore throat.  No nasal symptoms.  He just feels like it is hard to take a deep breath it just makes him a cough.  He recently restarted his Qvar and has been using his Ventolin as needed.  He said about a week and a half ago he did run what he thinks was a low-grade fever after recent colonoscopy but none since then..  Past Medical History:  Diagnosis Date  . Gout 12/31/2012  . Hypertension 12/31/2012  . Morbid obesity ([facility]) 12/31/2012  . Nonischemic cardiomyopathy ([facility]) 12/31/2012  . Prediabetes 08/30/2017  . Sarcoidosis 07/02/2008   Qualifier: Diagnosis of  By: Linford Arnold MD, Santina Evans      Past Surgical History:  Procedure Laterality Date  . NO PAST SURGERIES      Family History  Problem Relation Age of Onset  . Diabetes Mother   . Diabetes Sister     Social History   Socioeconomic History  . Marital status: Married    Spouse name: Not on file  . Number of children: Not on file  . Years of education: Not on file  . Highest education level: Not on file  Occupational History  . Not on file  Tobacco Use  . Smoking status: Never Smoker  . Smokeless tobacco: Never Used  Vaping Use  . Vaping Use: Never used  Substance and Sexual Activity  . Alcohol use: Yes  . Drug use: Never  . Sexual activity: Yes  Other Topics  Concern  . Not on file  Social History Narrative  . Not on file   Social Determinants of Health   Financial Resource Strain:   . Difficulty of Paying Living Expenses:   Food Insecurity:   . Worried About Programme researcher, broadcasting/film/video in the Last Year:   . Barista in the Last Year:   Transportation Needs:   . Freight forwarder (Medical):   Marland Kitchen Lack of Transportation (Non-Medical):   Physical Activity:   . Days of Exercise per Week:   . Minutes of Exercise per Session:   Stress:   . Feeling of Stress :   Social Connections:   . Frequency of Communication with Friends and Family:   . Frequency of Social Gatherings with Friends and Family:   . Attends Religious Services:   . Active Member of Clubs or Organizations:   . Attends Banker Meetings:   Marland Kitchen Marital Status:   Intimate Partner Violence:   . Fear of Current or Ex-Partner:   . Emotionally Abused:   Marland Kitchen Physically Abused:   . Sexually Abused:     Outpatient Medications Prior to Visit  Medication Sig Dispense Refill  . albuterol (VENTOLIN HFA)  108 (90 Base) MCG/ACT inhaler Inhale 2 puffs into the lungs every 6 (six) hours as needed for wheezing or shortness of breath.    . beclomethasone (QVAR) 80 MCG/ACT inhaler Inhale 1 puff into the lungs in the morning and at bedtime.    . predniSONE (DELTASONE) 20 MG tablet Take by mouth.    . indomethacin (INDOCIN) 50 MG capsule Take 1 capsule (50 mg total) by mouth 2 (two) times daily with a meal. As needed for gout flare. 60 capsule 0  . albuterol (VENTOLIN HFA) 108 (90 Base) MCG/ACT inhaler Inhale into the lungs.    Marland Kitchen NEEDLE, REUSABLE, 22 G 22G X 1-1/2" MISC Inject testosterone into muscle every 14 days. 12 each 2  . pneumococcal 23 valent vaccine (PNEUMOVAX 23) 25 MCG/0.5ML injection Inject into the muscle.    . predniSONE (DELTASONE) 10 MG tablet Take by mouth.    . Syringe/Needle, Disp, (SYRINGE 3CC/18GX1-1/2") 18G X 1-1/2" 3 ML MISC Inject testosterone into muscle  every 14 days. 100 each 0  . testosterone cypionate (DEPOTESTOSTERONE CYPIONATE) 200 MG/ML injection Inject 1 mL (200 mg total) into the muscle every 14 (fourteen) days. 6 mL 0  . TESTOSTERONE CYPIONATE IM Inject 200 mg into the muscle every 14 (fourteen) days.     No facility-administered medications prior to visit.    No Known Allergies  ROS Review of Systems    Objective:    Physical Exam Constitutional:      Appearance: He is well-developed.  HENT:     Head: Normocephalic and atraumatic.     Right Ear: External ear normal.     Left Ear: External ear normal.     Nose: Nose normal.  Eyes:     Conjunctiva/sclera: Conjunctivae normal.     Pupils: Pupils are equal, round, and reactive to light.  Neck:     Thyroid: No thyromegaly.  Cardiovascular:     Rate and Rhythm: Normal rate.     Heart sounds: Normal heart sounds.  Pulmonary:     Effort: Pulmonary effort is normal.     Breath sounds: Normal breath sounds.  Musculoskeletal:     Cervical back: Neck supple.  Lymphadenopathy:     Cervical: No cervical adenopathy.  Skin:    General: Skin is warm and dry.  Neurological:     Mental Status: He is alert and oriented to person, place, and time.     BP (!) 144/81   Pulse 81   Ht 6' (1.829 m)   Wt (!) 289 lb (131.1 kg)   SpO2 98%   BMI 39.20 kg/m  Wt Readings from Last 3 Encounters:  06/03/20 (!) 289 lb (131.1 kg)  10/24/19 286 lb (129.7 kg)  08/28/19 286 lb (129.7 kg)     There are no preventive care reminders to display for this patient.  There are no preventive care reminders to display for this patient.  Lab Results  Component Value Date   TSH 2.599 03/26/2009   Lab Results  Component Value Date   WBC 5.0 06/30/2019   HGB 14.9 06/30/2019   HCT 46.2 06/30/2019   MCV 84.5 06/30/2019   PLT 168 06/30/2019   Lab Results  Component Value Date   NA 136 09/05/2018   K 4.5 09/05/2018   CO2 26 09/05/2018   GLUCOSE 95 09/05/2018   BUN 17 09/05/2018    CREATININE 1.24 09/05/2018   BILITOT 0.6 09/05/2018   ALKPHOS 152 (H) 01/26/2017   AST 42 (H) 09/05/2018  ALT 49 (H) 09/05/2018   PROT 7.8 09/05/2018   ALBUMIN 3.6 01/26/2017   CALCIUM 9.5 09/05/2018   Lab Results  Component Value Date   CHOL 162 09/05/2018   Lab Results  Component Value Date   HDL 39 (L) 09/05/2018   Lab Results  Component Value Date   LDLCALC 104 (H) 09/05/2018   Lab Results  Component Value Date   TRIG 97 09/05/2018   Lab Results  Component Value Date   CHOLHDL 4.2 09/05/2018   Lab Results  Component Value Date   HGBA1C 5.9 (A) 06/26/2019      Assessment & Plan:   Problem List Items Addressed This Visit      Other   SARCOIDOSIS - Primary    I think he is having having a flare with his sarcoid.  He has an increase in sputum production but it still mostly clear or white.  No significant fevers or chills.  So we will treat with taper of prednisone over the next month.  His appointment with pulmonology has not until October but hopefully he can get something a little sooner.  Continue with Qvar twice a day.  If he is not improving over the weekend then I want him to give [place] a call back so we can consider chest x-ray and a CBC with differential and further work-up.  Did send over prescription for doxycycline just in case he feels like over the weekend he is getting worse, notices a change in sputum production, or develops a low-grade temperature.      Dyspnea    Other Visit Diagnoses    Cough       SOB (shortness of breath)          Meds ordered this encounter  Medications  . predniSONE (DELTASONE) 20 MG tablet    Sig: Take 3 tablets (60 mg total) by mouth daily with breakfast for 10 days, THEN 2 tablets (40 mg total) daily with breakfast for 10 days, THEN 1 tablet (20 mg total) daily with breakfast for 10 days, THEN 0.5 tablets (10 mg total) daily with breakfast for 10 days.    Dispense:  65 tablet    Refill:  0  . doxycycline (VIBRA-TABS)  100 MG tablet    Sig: Take 1 tablet (100 mg total) by mouth 2 (two) times daily.    Dispense:  20 tablet    Refill:  0     Follow-up: Return if symptoms worsen or fail to improve.    Nani Gasser, MD

## 2020-06-03 NOTE — Patient Instructions (Signed)
Call back if he not better and willl get chest xray and bloodwork

## 2020-06-26 ENCOUNTER — Other Ambulatory Visit: Payer: Self-pay | Admitting: Family Medicine

## 2021-07-14 ENCOUNTER — Telehealth: Payer: Self-pay | Admitting: *Deleted

## 2021-07-20 NOTE — Telephone Encounter (Signed)
error 

## 2021-08-11 ENCOUNTER — Telehealth: Payer: Self-pay | Admitting: Family Medicine

## 2021-08-11 NOTE — Telephone Encounter (Signed)
Dr. Linford Arnold    Patient walked in today inquiring about a letter for regarding his diagnosis of sarcoidosis. He states his job is requiring him to walk up all these flights of stairs and he is not able to and would like a letter stating this. Please advise.   Alex Odom

## 2021-08-12 NOTE — Telephone Encounter (Signed)
Okay for letter documenting sarcoidosis and limitation of more intense activities like going up and down stairs.  Just so that he understands this may trigger some type of accommodation form through work in which case he may need more specific information that may have to be obtained from his pulmonologist but for now we can certainly give him a work note.

## 2021-08-12 NOTE — Telephone Encounter (Signed)
Spoke with pt who states that he "took care of it" and no longer needs this letter written.  Tiajuana Amass, CMA

## 2022-09-12 ENCOUNTER — Ambulatory Visit
Admission: EM | Admit: 2022-09-12 | Discharge: 2022-09-12 | Disposition: A | Discharge status: Home / Self Care | Payer: 59 | Attending: Family Medicine | Admitting: Family Medicine

## 2022-09-12 DIAGNOSIS — Z79899 Other long term (current) drug therapy: Secondary | ICD-10-CM | POA: Diagnosis not present

## 2022-09-12 DIAGNOSIS — J309 Allergic rhinitis, unspecified: Secondary | ICD-10-CM | POA: Diagnosis present

## 2022-09-12 DIAGNOSIS — Z1152 Encounter for screening for COVID-19: Secondary | ICD-10-CM | POA: Insufficient documentation

## 2022-09-12 DIAGNOSIS — J0100 Acute maxillary sinusitis, unspecified: Secondary | ICD-10-CM | POA: Diagnosis not present

## 2022-09-12 DIAGNOSIS — R059 Cough, unspecified: Secondary | ICD-10-CM | POA: Diagnosis not present

## 2022-09-12 DIAGNOSIS — H6983 Other specified disorders of Eustachian tube, bilateral: Secondary | ICD-10-CM | POA: Diagnosis not present

## 2022-09-12 DIAGNOSIS — R0981 Nasal congestion: Secondary | ICD-10-CM | POA: Insufficient documentation

## 2022-09-12 DIAGNOSIS — L539 Erythematous condition, unspecified: Secondary | ICD-10-CM | POA: Insufficient documentation

## 2022-09-12 LAB — RESP PANEL BY RT-PCR (FLU A&B, COVID) ARPGX2
Influenza A by PCR: NEGATIVE
Influenza B by PCR: NEGATIVE
SARS Coronavirus 2 by RT PCR: NEGATIVE

## 2022-09-12 MED ORDER — AMOXICILLIN-POT CLAVULANATE 875-125 MG PO TABS: 1.0000 | ORAL_TABLET | Freq: Two times a day (BID) | ORAL | 0 refills | Status: DC

## 2022-09-12 MED ORDER — FEXOFENADINE HCL 180 MG PO TABS: 180.0000 mg | ORAL_TABLET | Freq: Every day | ORAL | 0 refills | Status: DC

## 2022-09-12 NOTE — ED Triage Notes (Signed)
Pt c/o cough and headache x 3 days. Also had had a runny nose. No known temperature. Taking theraflu and excedrin.

## 2022-09-12 NOTE — Discharge Instructions (Addendum)
Advised patient to take medication as directed with food to completion.  Advised patient to take Allegra with first dose of Augmentin for the next 5 of 7 days.  Advised may use Allegra as needed afterwards for concurrent postnasal drip/drainage.  Encouraged patient to increase daily water intake while taking these medications.  Advised patient we will follow-up with COVID-19/Influenza results once received.  Advised if symptoms worsen and/or unresolved please follow-up with PCP or here for further evaluation.

## 2022-09-12 NOTE — ED Provider Notes (Signed)
[facility]    CSN: [id number] Arrival date & time: 09/12/22  1553      History   Chief Complaint Chief Complaint  Patient presents with   Cough   Headache    HPI Alex Odom is a 51 y.o. male.   HPI 51 year old male presents with cough and headache for 3-4 days.  Reports taking OTC TheraFlu and Excedrin.  PMH significant for morbid obesity, nonischemic cardiomyopathy, and HTN.  Patient is accompanied by his wife  Past Medical History:  Diagnosis Date   Gout 12/31/2012   Hypertension 12/31/2012   Morbid obesity (HCC) 12/31/2012   Nonischemic cardiomyopathy (HCC) 12/31/2012   Prediabetes 08/30/2017   Sarcoidosis 07/02/2008   Qualifier: Diagnosis of  By: Linford Arnold MD, Catherine      Patient Active Problem List   Diagnosis Date Noted   Encounter for screening colonoscopy 05/24/2020   Dyspnea 08/14/2019   Low testosterone in male 08/11/2019   IFG (impaired fasting glucose) 07/02/2019   Benign prostatic hyperplasia with nocturia 06/26/2019   Isolated proteinuria without specific morphologic lesion 06/26/2019   Prediabetes 08/30/2017   Hypertension 12/31/2012   Gout 12/31/2012   Nonischemic cardiomyopathy (HCC) 12/31/2012   Morbid obesity (HCC) 12/31/2012   Interstitial lung disease (HCC) 09/07/2011   SARCOIDOSIS 07/02/2008   Transaminitis 06/16/2008   ERECTILE DYSFUNCTION 06/15/2008    Past Surgical History:  Procedure Laterality Date   NO PAST SURGERIES         Home Medications    Prior to Admission medications   Medication Sig Start Date End Date Taking? Authorizing Provider  amoxicillin-clavulanate (AUGMENTIN) 875-125 MG tablet Take 1 tablet by mouth every 12 (twelve) hours. 09/12/22  Yes Trevor Iha, FNP  fexofenadine [facility] ALLERGY) 180 MG tablet Take 1 tablet (180 mg total) by mouth daily for 15 days. 09/12/22 09/27/22 Yes Trevor Iha, FNP  albuterol (VENTOLIN HFA) 108 (90 Base) MCG/ACT inhaler Inhale 2 puffs into the lungs every 6  (six) hours as needed for wheezing or shortness of breath. 05/26/20   [provider]  beclomethasone (QVAR) 80 MCG/ACT inhaler Inhale 1 puff into the lungs in the morning and at bedtime. 05/26/20   [provider]  indomethacin (INDOCIN) 50 MG capsule Take 1 capsule (50 mg total) by mouth 2 (two) times daily with a meal. As needed for gout flare. 09/17/19   Agapito Games, MD    Family History Family History  Problem Relation Age of Onset   Diabetes Mother    Diabetes Sister     Social History Social History   Tobacco Use   Smoking status: Never   Smokeless tobacco: Never  Vaping Use   Vaping Use: Never used  Substance Use Topics   Alcohol use: Yes   Drug use: Never     Allergies   Patient has no known allergies.   Review of Systems Review of Systems  HENT:  Positive for congestion and sinus pressure.   Respiratory:  Positive for cough.   All other systems reviewed and are negative.    Physical Exam Triage Vital Signs ED Triage Vitals  Enc Vitals Group     BP 09/12/22 1606 134/82     Pulse Rate 09/12/22 1606 79     Resp 09/12/22 1606 17     Temp 09/12/22 1606 98 F (36.7 C)     Temp Source 09/12/22 1606 Oral     SpO2 09/12/22 1606 97 %     Weight --  Height --      Head Circumference --      Peak Flow --      Pain Score 09/12/22 1607 0     Pain Loc --      Pain Edu? --      Excl. in GC? --    No data found.  Updated Vital Signs BP 134/82 (BP Location: Right Arm)   Pulse 79   Temp 98 F (36.7 C) (Oral)   Resp 17   SpO2 97%    Physical Exam Vitals and nursing note reviewed.  Constitutional:      General: He is not in acute distress.    Appearance: He is obese. He is not ill-appearing.  HENT:     Head: Normocephalic and atraumatic.     Right Ear: Tympanic membrane and external ear normal.     Left Ear: Tympanic membrane and external ear normal.     Ears:     Comments: Moderate eustachian tube dysfunction noted  bilaterally    Nose:     Comments: Turbinates are erythematous/edematous    Mouth/Throat:     Mouth: Mucous membranes are moist.     Pharynx: Oropharynx is clear.     Comments: Significant amount of clear drainage of posterior oropharynx noted Eyes:     Extraocular Movements: Extraocular movements intact.     Conjunctiva/sclera: Conjunctivae normal.     Pupils: Pupils are equal, round, and reactive to light.  Cardiovascular:     Rate and Rhythm: Normal rate and regular rhythm.     Pulses: Normal pulses.     Heart sounds: Normal heart sounds. No murmur heard. Pulmonary:     Effort: Pulmonary effort is normal.     Breath sounds: Normal breath sounds. No wheezing, rhonchi or rales.     Comments: Infrequent nonproductive cough noted Musculoskeletal:     Cervical back: Normal range of motion and neck supple.  Skin:    General: Skin is warm and dry.  Neurological:     General: No focal deficit present.     Mental Status: He is alert and oriented to person, place, and time. Mental status is at baseline.      UC Treatments / Results  Labs (all labs ordered are listed, but only abnormal results are displayed) Labs Reviewed  RESP PANEL BY RT-PCR (FLU A&B, COVID) ARPGX2    EKG   Radiology No results found.  Procedures Procedures (including critical care time)  Medications Ordered in UC Medications - No data to display  Initial Impression / Assessment and Plan / UC Course  I have reviewed the triage vital signs and the nursing notes.  Pertinent labs & imaging results that were available during my care of the patient were reviewed by me and considered in my medical decision making (see chart for details).     MDM: 1.  Subacute maxillary sinusitis-Rx'd Augmentin; 2.  Allergic rhinitis-Rx'd Allegra; 3.  Cough-lab 62130 ordered. Advised patient to take medication as directed with food to completion.  Advised patient to take Allegra with first dose of Augmentin for the next 5  of 7 days.  Advised may use Allegra as needed afterwards for concurrent postnasal drip/drainage.  Encouraged patient to increase daily water intake while taking these medications.  Advised patient we will follow-up with COVID-19/Influenza results once received.  Advised if symptoms worsen and/or unresolved please follow-up with PCP or here for further evaluation.  Final Clinical Impressions(s) / UC Diagnoses   Final diagnoses:  Cough, unspecified type  Subacute maxillary sinusitis  Allergic rhinitis, unspecified seasonality, unspecified trigger     Discharge Instructions      Advised patient to take medication as directed with food to completion.  Advised patient to take Allegra with first dose of Augmentin for the next 5 of 7 days.  Advised may use Allegra as needed afterwards for concurrent postnasal drip/drainage.  Encouraged patient to increase daily water intake while taking these medications.  Advised patient we will follow-up with COVID-19/Influenza results once received.  Advised if symptoms worsen and/or unresolved please follow-up with PCP or here for further evaluation.     ED Prescriptions     Medication Sig Dispense Auth. Provider   amoxicillin-clavulanate (AUGMENTIN) 875-125 MG tablet Take 1 tablet by mouth every 12 (twelve) hours. 14 tablet Eliezer Lofts, FNP   fexofenadine [facility] ALLERGY) 180 MG tablet Take 1 tablet (180 mg total) by mouth daily for 15 days. 15 tablet Eliezer Lofts, FNP      PDMP not reviewed this encounter.   Eliezer Lofts, [place] 09/12/22 [phone]

## 2022-09-13 ENCOUNTER — Telehealth: Payer: Self-pay | Admitting: Emergency Medicine

## 2022-10-04 ENCOUNTER — Other Ambulatory Visit: Payer: Self-pay

## 2022-10-04 MED ORDER — INDOMETHACIN 50 MG PO CAPS: 50.0000 mg | ORAL_CAPSULE | Freq: Two times a day (BID) | ORAL | 0 refills | Status: DC

## 2022-11-28 ENCOUNTER — Telehealth (INDEPENDENT_AMBULATORY_CARE_PROVIDER_SITE_OTHER): Payer: Self-pay | Admitting: Family Medicine

## 2022-11-28 DIAGNOSIS — Z91199 Patient's noncompliance with other medical treatment and regimen due to unspecified reason: Secondary | ICD-10-CM

## 2022-11-28 NOTE — Progress Notes (Signed)
LVM advising Alex Odom that I was calling to do prescreening. If unable to get in touch with him in the next 10 mins I asked that he go ahead and log into his mychart so Dr. Madilyn Fireman can start his video visit today at 1 pm.

## 2022-11-29 NOTE — Progress Notes (Signed)
Attempted to call him a couple of times.  Answer.

## 2023-04-12 ENCOUNTER — Ambulatory Visit (INDEPENDENT_AMBULATORY_CARE_PROVIDER_SITE_OTHER): Payer: Managed Care, Other (non HMO) | Admitting: Family Medicine

## 2023-04-12 ENCOUNTER — Encounter: Payer: Self-pay | Admitting: Family Medicine

## 2023-04-12 ENCOUNTER — Ambulatory Visit: Payer: Managed Care, Other (non HMO)

## 2023-04-12 VITALS — BP 126/73 | HR 100 | Ht 72.0 in | Wt 278.0 lb

## 2023-04-12 DIAGNOSIS — M1A0620 Idiopathic chronic gout, left knee, without tophus (tophi): Secondary | ICD-10-CM | POA: Diagnosis not present

## 2023-04-12 DIAGNOSIS — R7303 Prediabetes: Secondary | ICD-10-CM | POA: Diagnosis not present

## 2023-04-12 DIAGNOSIS — M25562 Pain in left knee: Secondary | ICD-10-CM

## 2023-04-12 LAB — POCT GLYCOSYLATED HEMOGLOBIN (HGB A1C): Hemoglobin A1C: 5.9 % — AB (ref 4.0–5.6)

## 2023-04-12 MED ORDER — MELOXICAM 15 MG PO TABS: 15.0000 mg | ORAL_TABLET | Freq: Every day | ORAL | 0 refills | Status: DC | PRN

## 2023-04-12 NOTE — Progress Notes (Signed)
   Acute Office Visit  Subjective:     Patient ID: Alex Odom, male    DOB: 29-Mar-1971, 52 y.o.   MRN: [id number]  Chief Complaint  Patient presents with   Gout    L leg x 1 month    HPI Patient is in today for medial left knee pain he says it started about a month ago.  No known injury or trauma.  It is tender mostly along the joint line.  Sometimes will actually get a burning sensation across the anterior knee.  He does have a old prescription for indomethacin which he uses occasionally.  ROS      Objective:    BP 126/73   Pulse 100   Ht 6' (1.829 m)   Wt 278 lb (126.1 kg)   SpO2 100%   BMI 37.70 kg/m    Physical Exam  Results for orders placed or performed in visit on 04/12/23  POCT glycosylated hemoglobin (Hb A1C)  Result Value Ref Range   Hemoglobin A1C 5.9 (A) 4.0 - 5.6 %   HbA1c POC (<> result, manual entry)     HbA1c, POC (prediabetic range)     HbA1c, POC (controlled diabetic range)          Assessment & Plan:   Problem List Items Addressed This Visit       Other   RESOLVED: Prediabetes - Primary   Relevant Orders   POCT glycosylated hemoglobin (Hb A1C) (Completed)   Uric acid   CMP14+EGFR   Gout   Relevant Orders   Uric acid   CMP14+EGFR   Other Visit Diagnoses     Left medial knee pain       Relevant Medications   meloxicam (MOBIC) 15 MG tablet   Other Relevant Orders   DG Knee Complete 4 Views Left      Left medial knee pain-consider OA versus medial meniscal tear.  He did have some swelling initially.  Pains been persistent for a month at this point.  We discussed a trial of ice and anti-inflammatories.  Will go ahead and get plain film today for further evaluation.  He might even want to consider a support sleeve just to see if it is improves his comfort.  Do not think this was a gout flare.  Gout-not currently on a controller we will plan to check uric acid level today.  Meds ordered this encounter  Medications   meloxicam  (MOBIC) 15 MG tablet    Sig: Take 1 tablet (15 mg total) by mouth daily as needed for pain.    Dispense:  30 tablet    Refill:  0    Don't mix with indomethacin    No follow-ups on file.  Nani Gasser, MD

## 2023-04-13 LAB — URIC ACID: Uric Acid: 7.9 mg/dL (ref 3.8–8.4)

## 2023-04-13 LAB — CMP14+EGFR
ALT: 35 IU/L (ref 0–44)
AST: 33 IU/L (ref 0–40)
Albumin/Globulin Ratio: 1.3 (ref 1.2–2.2)
Albumin: 4.1 g/dL (ref 3.8–4.9)
Alkaline Phosphatase: 150 IU/L — ABNORMAL HIGH (ref 44–121)
BUN/Creatinine Ratio: 15 (ref 9–20)
BUN: 19 mg/dL (ref 6–24)
Bilirubin Total: 0.3 mg/dL (ref 0.0–1.2)
CO2: 22 mmol/L (ref 20–29)
Calcium: 9.3 mg/dL (ref 8.7–10.2)
Chloride: 102 mmol/L (ref 96–106)
Creatinine, Ser: 1.25 mg/dL (ref 0.76–1.27)
Globulin, Total: 3.1 g/dL (ref 1.5–4.5)
Glucose: 118 mg/dL — ABNORMAL HIGH (ref 70–99)
Potassium: 4.4 mmol/L (ref 3.5–5.2)
Sodium: 139 mmol/L (ref 134–144)
Total Protein: 7.2 g/dL (ref 6.0–8.5)
eGFR: 68 mL/min/{1.73_m2} (ref >59)

## 2023-04-13 NOTE — Progress Notes (Signed)
His alkaline phosphatase level has been mildly elevated for years so I think this is just his true baseline.  I do not think it has anything to do with his knee.  I have not seen the knee film come back in yet but I will keep my eyes out hopefully will be back later today.

## 2023-04-13 NOTE — Progress Notes (Signed)
Hi Tyan, your LIver function looks better. Alk phose still mildly elevated but stable

## 2023-04-16 NOTE — Progress Notes (Signed)
Alex Odom, x-ray actually looks really reassuring.  You do have a little spur on the kneecap but I do not think this is actually causing your pain.  If not improving over the next couple of weeks with a new anti-inflammatory and some home stretches then please let me know and like to get you in with our sports med doc for further workup.

## 2023-05-10 ENCOUNTER — Other Ambulatory Visit: Payer: Self-pay | Admitting: Family Medicine

## 2023-05-10 DIAGNOSIS — M25562 Pain in left knee: Secondary | ICD-10-CM

## 2023-09-19 ENCOUNTER — Encounter: Payer: Self-pay | Admitting: Family Medicine

## 2023-09-19 ENCOUNTER — Ambulatory Visit (INDEPENDENT_AMBULATORY_CARE_PROVIDER_SITE_OTHER): Payer: BC Managed Care – PPO | Admitting: Family Medicine

## 2023-09-19 ENCOUNTER — Other Ambulatory Visit: Payer: Self-pay | Admitting: Family Medicine

## 2023-09-19 VITALS — BP 157/72 | HR 61 | Ht 72.0 in | Wt 277.0 lb

## 2023-09-19 DIAGNOSIS — J40 Bronchitis, not specified as acute or chronic: Secondary | ICD-10-CM | POA: Diagnosis not present

## 2023-09-19 DIAGNOSIS — J329 Chronic sinusitis, unspecified: Secondary | ICD-10-CM | POA: Diagnosis not present

## 2023-09-19 DIAGNOSIS — Z111 Encounter for screening for respiratory tuberculosis: Secondary | ICD-10-CM | POA: Insufficient documentation

## 2023-09-19 MED ORDER — AZITHROMYCIN 250 MG PO TABS: ORAL_TABLET | ORAL | 0 refills | Status: AC

## 2023-09-19 MED ORDER — AZITHROMYCIN 250 MG PO TABS: ORAL_TABLET | ORAL | 0 refills | Status: DC

## 2023-09-19 MED ORDER — PREDNISONE 20 MG PO TABS: 20.0000 mg | ORAL_TABLET | Freq: Two times a day (BID) | ORAL | 0 refills | Status: AC

## 2023-09-19 NOTE — Addendum Note (Signed)
Addended by: Mammie Lorenzo on: 09/19/2023 02:58 PM   Modules accepted: Orders

## 2023-09-19 NOTE — Patient Instructions (Signed)
Return after 2pm on Friday to have TB test read.

## 2023-09-19 NOTE — Assessment & Plan Note (Signed)
Will treat aggressively with combination of azithromycin and prednisone burst.  Encouraged supportive care at home with increased fluids.  Precautions and red flags discussed.

## 2023-09-19 NOTE — Addendum Note (Signed)
Addended by: Ardyth Man on: 09/19/2023 02:50 PM   Modules accepted: Orders

## 2023-09-19 NOTE — Progress Notes (Signed)
Alex Odom - 52 y.o. male MRN [id number]  Date of birth: 10/03/1971  Subjective Chief Complaint  Patient presents with   URI    HPI Alex Odom is a 52 y.o. male here today with complaint of chest and nasal congestion, cough and mild dyspnea.  Symptoms started about 1 week ago.  He has tried theraflu without slight improvement.  He denies fever or chills.  He is drinking a good amount of fluids.   Also needs PPD for pre-employment screening  ROS:  A comprehensive ROS was completed and negative except as noted per HPI  No Known Allergies  Past Medical History:  Diagnosis Date   Gout 12/31/2012   Hypertension 12/31/2012   Hypertension 12/31/2012   Morbid obesity (HCC) 12/31/2012   Nonischemic cardiomyopathy (HCC) 12/31/2012   Prediabetes 08/30/2017   Prediabetes 08/30/2017   Sarcoidosis 07/02/2008   Qualifier: Diagnosis of  By: Linford Arnold MD, Santina Evans      Past Surgical History:  Procedure Laterality Date   NO PAST SURGERIES      Social History   Socioeconomic History   Marital status: Married    Spouse name: Not on file   Number of children: Not on file   Years of education: Not on file   Highest education level: Not on file  Occupational History   Not on file  Tobacco Use   Smoking status: Never   Smokeless tobacco: Never  Vaping Use   Vaping status: Never Used  Substance and Sexual Activity   Alcohol use: Yes   Drug use: Never   Sexual activity: Yes  Other Topics Concern   Not on file  Social History Narrative   Not on file   Social Determinants of Health   Financial Resource Strain: Not on file  Food Insecurity: No Food Insecurity (07/13/2022)   Received from [facility], [facility]   Hunger Vital Sign    Worried About Running Out of Food in the Last Year: Never true    Ran Out of Food in the Last Year: Never true  Transportation Needs: Not on file  Physical Activity: Not on file  Stress: Not on file  Social Connections: Unknown  (03/25/2022)   Received from [facility], [facility]   Social Network    Social Network: Not on file    Family History  Problem Relation Age of Onset   Diabetes Mother    Diabetes Sister     Health Maintenance  Topic Date Due   INFLUENZA VACCINE  06/14/2023   COVID-19 Vaccine (3 - 2023-24 season) 07/15/2023   Zoster Vaccines- Shingrix (1 of 2) 12/14/2023 (Originally 11/27/2018)   DTaP/Tdap/Td (2 - Td or Tdap) 01/27/2027   Colonoscopy  05/24/2030   Hepatitis C Screening  Completed   HIV Screening  Completed   Pneumococcal Vaccine 38-44 Years old  Aged Out   HPV VACCINES  Aged Out     ----------------------------------------------------------------------------------------------------------------------------------------------------------------------------------------------------------------- Physical Exam BP (!) 157/72 (BP Location: Left Arm, Patient Position: Sitting, Cuff Size: Large)   Pulse 61   Ht 6' (1.829 m)   Wt 277 lb (125.6 kg)   SpO2 97%   BMI 37.57 kg/m   Physical Exam Constitutional:      Appearance: Normal appearance.  HENT:     Head: Normocephalic and atraumatic.  Cardiovascular:     Rate and Rhythm: Normal rate and regular rhythm.  Pulmonary:     Breath sounds: Wheezing present.  Musculoskeletal:     Cervical back: Neck  supple.  Neurological:     Mental Status: He is alert.  Psychiatric:        Mood and Affect: Mood normal.        Behavior: Behavior normal.     ------------------------------------------------------------------------------------------------------------------------------------------------------------------------------------------------------------------- Assessment and Plan  Sinobronchitis Will treat aggressively with combination of azithromycin and prednisone burst.  Encouraged supportive care at home with increased fluids.  Precautions and red flags discussed.   Screening examination for pulmonary tuberculosis PPD placed  today.  Return in 48-72 hours to have this read.    Meds ordered this encounter  Medications   azithromycin (ZITHROMAX) 250 MG tablet    Sig: Take 2 tablets on day 1, then 1 tablet daily on days 2 through 5    Dispense:  6 tablet    Refill:  0   predniSONE (DELTASONE) 20 MG tablet    Sig: Take 1 tablet (20 mg total) by mouth 2 (two) times daily with a meal for 5 days.    Dispense:  10 tablet    Refill:  0    No follow-ups on file.    This visit occurred during the SARS-CoV-2 public health emergency.  Safety protocols were in place, including screening questions prior to the visit, additional usage of staff PPE, and extensive cleaning of exam room while observing appropriate contact time as indicated for disinfecting solutions.

## 2023-09-19 NOTE — Addendum Note (Signed)
Addended by: Mammie Lorenzo on: 09/19/2023 02:45 PM   Modules accepted: Orders

## 2023-09-19 NOTE — Assessment & Plan Note (Signed)
PPD placed today.  Return in 48-72 hours to have this read.

## 2023-09-21 ENCOUNTER — Ambulatory Visit (INDEPENDENT_AMBULATORY_CARE_PROVIDER_SITE_OTHER): Payer: BC Managed Care – PPO

## 2023-09-21 VITALS — BP 170/98 | HR 66 | Ht 72.0 in | Wt 281.2 lb

## 2023-09-21 DIAGNOSIS — Z111 Encounter for screening for respiratory tuberculosis: Secondary | ICD-10-CM

## 2023-09-21 LAB — TB SKIN TEST
Induration: 2 mm
TB Skin Test: NEGATIVE

## 2023-09-21 NOTE — Progress Notes (Unsigned)
HPI   Patient is here for PPD read.             Assessment and plan:  Results were negative.

## 2023-10-02 ENCOUNTER — Ambulatory Visit: Payer: BC Managed Care – PPO | Admitting: Sports Medicine

## 2023-10-02 ENCOUNTER — Encounter: Payer: Self-pay | Admitting: Sports Medicine

## 2023-10-02 DIAGNOSIS — M25562 Pain in left knee: Secondary | ICD-10-CM

## 2023-10-02 DIAGNOSIS — M170 Bilateral primary osteoarthritis of knee: Secondary | ICD-10-CM | POA: Diagnosis not present

## 2023-10-02 MED ORDER — DICLOFENAC SODIUM 1 % EX GEL: 4.0000 g | Freq: Four times a day (QID) | CUTANEOUS | 11 refills | Status: DC

## 2023-10-02 MED ORDER — MELOXICAM 15 MG PO TABS: 15.0000 mg | ORAL_TABLET | Freq: Every day | ORAL | 3 refills | Status: DC | PRN

## 2023-10-02 NOTE — Progress Notes (Signed)
    Procedures performed today:    None.  Independent interpretation of notes and tests performed by another provider:   None.  Brief History, Exam, Impression, and Recommendations:    Primary osteoarthritis of both knees This very pleasant 52 year old male with a history of gout has had several months of pain in both knees left worse than right, moderate swelling left knee, no mechanical symptoms. X-rays did show patellofemoral arthritis. He got some prednisone for a different issue and improved significantly. Meloxicam also seems to help a lot. On exam he has a left knee effusion and tenderness peripatellar. Otherwise good motion and good strength. Refilling meloxicam, adding topical Voltaren, home physical therapy, return to see me in 6 weeks, injection if not better. I think his current pain is due more to osteoarthritis rather than a gout flare.    ____________________________________________ Ihor Austin. Benjamin Stain, M.D., ABFM., CAQSM., AME. Primary Care and Sports Medicine [facility]  Adjunct Professor of Family Medicine  [place] [employer]  Restaurant manager, fast food

## 2023-10-02 NOTE — Assessment & Plan Note (Signed)
This very pleasant 52 year old male with a history of gout has had several months of pain in both knees left worse than right, moderate swelling left knee, no mechanical symptoms. X-rays did show patellofemoral arthritis. He got some prednisone for a different issue and improved significantly. Meloxicam also seems to help a lot. On exam he has a left knee effusion and tenderness peripatellar. Otherwise good motion and good strength. Refilling meloxicam, adding topical Voltaren, home physical therapy, return to see me in 6 weeks, injection if not better. I think his current pain is due more to osteoarthritis rather than a gout flare.

## 2023-10-08 ENCOUNTER — Encounter: Payer: Self-pay | Admitting: Family Medicine

## 2023-10-08 ENCOUNTER — Ambulatory Visit (INDEPENDENT_AMBULATORY_CARE_PROVIDER_SITE_OTHER): Payer: BC Managed Care – PPO | Admitting: Family Medicine

## 2023-10-08 VITALS — BP 152/84 | HR 66 | Ht 72.0 in | Wt 276.0 lb

## 2023-10-08 DIAGNOSIS — I10 Essential (primary) hypertension: Secondary | ICD-10-CM

## 2023-10-08 DIAGNOSIS — R748 Abnormal levels of other serum enzymes: Secondary | ICD-10-CM | POA: Diagnosis not present

## 2023-10-08 DIAGNOSIS — R7301 Impaired fasting glucose: Secondary | ICD-10-CM

## 2023-10-08 DIAGNOSIS — R7401 Elevation of levels of liver transaminase levels: Secondary | ICD-10-CM

## 2023-10-08 DIAGNOSIS — R931 Abnormal findings on diagnostic imaging of heart and coronary circulation: Secondary | ICD-10-CM | POA: Insufficient documentation

## 2023-10-08 DIAGNOSIS — R350 Frequency of micturition: Secondary | ICD-10-CM | POA: Diagnosis not present

## 2023-10-08 MED ORDER — VALSARTAN 40 MG PO TABS: 40.0000 mg | ORAL_TABLET | Freq: Every morning | ORAL | 0 refills | Status: DC

## 2023-10-08 NOTE — Progress Notes (Signed)
Established Patient Office Visit  Subjective   Patient ID: GIVEN CHIDESTER, male    DOB: 07-Jun-1971  Age: 52 y.o. MRN: [id number]  Chief Complaint  Patient presents with   Hypertension    HPI  He is here today for high blood pressures.  He started checking his blood pressure at [facility] as he was having some headaches blood pressures were as high as 190 and even 200 systolic.  He had been having some knee pain and saw our sports med doc he was placed in meloxicam but he said he only ever took 2 doses of it he is mostly been using the topical gel and that actually is been working great.  Last seen for chronic care management back in May.  He was seen recently for sinusitis.  Impaired fasting glucose-no increased thirst or urination. No symptoms consistent with hypoglycemia.  Denies any chest pain or shortness of breath he has noticed a little bit of increase in frequent urination.  He denies taking any decongestants or increasing caffeine lately he did take Excedrin a few times for the headaches.  He is actually been trying to eat more of a low-salt diet since he started paying attention to the blood pressure.     ROS    Objective:     BP (!) 152/84   Pulse 66   Ht 6' (1.829 m)   Wt 276 lb 0.6 oz (125.2 kg)   SpO2 97%   BMI 37.44 kg/m    Physical Exam Vitals and nursing note reviewed.  Constitutional:      Appearance: Normal appearance.  HENT:     Head: Normocephalic and atraumatic.  Eyes:     Conjunctiva/sclera: Conjunctivae normal.  Neck:     Vascular: No carotid bruit.  Cardiovascular:     Rate and Rhythm: Normal rate and regular rhythm.  Pulmonary:     Effort: Pulmonary effort is normal.     Breath sounds: Normal breath sounds.  Abdominal:     General: Bowel sounds are normal.     Palpations: Abdomen is soft.     Comments: No renal bruits  Musculoskeletal:     Cervical back: No tenderness.  Skin:    General: Skin is warm and dry.  Neurological:     Mental  Status: He is alert.  Psychiatric:        Mood and Affect: Mood normal.      No results found for any visits on 10/08/23.    The ASCVD Risk score (Arnett DK, et al., 2019) failed to calculate for the following reasons:   Cannot find a previous HDL lab   Cannot find a previous total cholesterol lab    Assessment & Plan:   Problem List Items Addressed This Visit       Cardiovascular and Mediastinum   Essential hypertension - Primary    Blood pressure is back up after being well-controlled for quite a long time.  He really is not taking any stimulants or decongestants he only took an NSAID a couple days and has not been on it since last week.  Home blood pressures have been high as well he plans on getting his own machine to check it encouraged him to continue with a low-salt diet in addition information provided on Dash.  Will go ahead and start an ARB.  I would like to get some up-to-date labs just to check for renal function, thyroid disorder etc. make sure that there are no underlying  causes will check urine for protein loss as well we will call results once available.  Did remind him that NSAIDs can raise blood pressure so keep an eye on that.      Relevant Medications   valsartan (DIOVAN) 40 MG tablet   Other Relevant Orders   CMP14+EGFR   Lipid panel   CBC   Uric acid   Hemoglobin A1c   TSH   ECHOCARDIOGRAM COMPLETE     Endocrine   IFG (impaired fasting glucose)    Due for updated A1C       Relevant Orders   CMP14+EGFR   Lipid panel   CBC   Uric acid   Hemoglobin A1c   TSH     Other   Transaminitis    Plan to recheck liver function .        Abnormal echocardiogram    I do think it would be prudent to go ahead and get an echo to date echocardiogram since his last 1 was almost a decade ago and he did have some global hypokinesis that was mild as well as some mild dilatation left ventricle with low normal ejection fraction.  Echo 02/2013: The left ventricle  is mildly dilated.   Left ventricular systolic function is low normal.  LV ejection fraction = 50-55%.  The right ventricle is normal in size and function.   Left ventricular filling pattern is normal.  The left atrium is mildly dilated.  There is trace mitral regurgitation.  There is trace tricuspid regurgitation.  There is no pericardial effusion.  Probably no significant change in comparison with the prior study of 08-05-2012.        Relevant Orders   ECHOCARDIOGRAM COMPLETE   Other Visit Diagnoses     Elevated alkaline phosphatase level       Relevant Orders   CMP14+EGFR   Lipid panel   CBC   Uric acid   Hemoglobin A1c   TSH   Urinary frequency       Relevant Orders   Urinalysis, Routine w reflex microscopic       Urinary frequency-will do a urinalysis today also wanted check for protein loss which could be contributing to blood pressure issues.  Last echo I can find on record was through care everywhere April 2014 showing EF of 50 to 55% which is low normal.  The left atrium is mildly dilated with trace mitral regurg and trace tricuspid regurg.  Did have some borderline global hypokinesis of the left ventricle.  Return in about 4 weeks (around 11/05/2023) for Nurse BP Check.    Nani Gasser, MD

## 2023-10-08 NOTE — Assessment & Plan Note (Signed)
Blood pressure is back up after being well-controlled for quite a long time.  He really is not taking any stimulants or decongestants he only took an NSAID a couple days and has not been on it since last week.  Home blood pressures have been high as well he plans on getting his own machine to check it encouraged him to continue with a low-salt diet in addition information provided on Dash.  Will go ahead and start an ARB.  I would like to get some up-to-date labs just to check for renal function, thyroid disorder etc. make sure that there are no underlying causes will check urine for protein loss as well we will call results once available.  Did remind him that NSAIDs can raise blood pressure so keep an eye on that.

## 2023-10-08 NOTE — Assessment & Plan Note (Signed)
Plan to recheck liver function.

## 2023-10-08 NOTE — Assessment & Plan Note (Signed)
I do think it would be prudent to go ahead and get an echo to date echocardiogram since his last 1 was almost a decade ago and he did have some global hypokinesis that was mild as well as some mild dilatation left ventricle with low normal ejection fraction.  Echo 02/2013: The left ventricle is mildly dilated.   Left ventricular systolic function is low normal.  LV ejection fraction = 50-55%.  The right ventricle is normal in size and function.   Left ventricular filling pattern is normal.  The left atrium is mildly dilated.  There is trace mitral regurgitation.  There is trace tricuspid regurgitation.  There is no pericardial effusion.  Probably no significant change in comparison with the prior study of 08-05-2012.

## 2023-10-08 NOTE — Assessment & Plan Note (Signed)
Due for updated A1C.

## 2023-10-09 LAB — CBC
Hematocrit: 45.1 % (ref 37.5–51.0)
Hemoglobin: 14.5 g/dL (ref 13.0–17.7)
MCH: 27.7 pg (ref 26.6–33.0)
MCHC: 32.2 g/dL (ref 31.5–35.7)
MCV: 86 fL (ref 79–97)
Platelets: 155 10*3/uL (ref 150–450)
RBC: 5.24 x10E6/uL (ref 4.14–5.80)
RDW: 15 % (ref 11.6–15.4)
WBC: 4.4 10*3/uL (ref 3.4–10.8)

## 2023-10-09 LAB — CMP14+EGFR
ALT: 27 [IU]/L (ref 0–44)
AST: 31 [IU]/L (ref 0–40)
Albumin: 3.9 g/dL (ref 3.8–4.9)
Alkaline Phosphatase: 142 [IU]/L — ABNORMAL HIGH (ref 44–121)
BUN/Creatinine Ratio: 12 (ref 9–20)
BUN: 14 mg/dL (ref 6–24)
Bilirubin Total: 0.4 mg/dL (ref 0.0–1.2)
CO2: 23 mmol/L (ref 20–29)
Calcium: 9.1 mg/dL (ref 8.7–10.2)
Chloride: 104 mmol/L (ref 96–106)
Creatinine, Ser: 1.18 mg/dL (ref 0.76–1.27)
Globulin, Total: 3 g/dL (ref 1.5–4.5)
Glucose: 103 mg/dL — ABNORMAL HIGH (ref 70–99)
Potassium: 4.3 mmol/L (ref 3.5–5.2)
Sodium: 139 mmol/L (ref 134–144)
Total Protein: 6.9 g/dL (ref 6.0–8.5)
eGFR: 73 mL/min/{1.73_m2} (ref >59)

## 2023-10-09 LAB — HEMOGLOBIN A1C
Est. average glucose Bld gHb Est-mCnc: 137 mg/dL
Hgb A1c MFr Bld: 6.4 % — ABNORMAL HIGH (ref 4.8–5.6)

## 2023-10-09 LAB — MICROSCOPIC EXAMINATION
Bacteria, UA: NONE SEEN
Casts: NONE SEEN /[LPF]
Epithelial Cells (non renal): NONE SEEN /[HPF] (ref 0–10)
RBC, Urine: NONE SEEN /[HPF] (ref 0–2)
WBC, UA: NONE SEEN /[HPF] (ref 0–5)

## 2023-10-09 LAB — TSH: TSH: 1.61 u[IU]/mL (ref 0.450–4.500)

## 2023-10-09 LAB — URINALYSIS, ROUTINE W REFLEX MICROSCOPIC
Bilirubin, UA: NEGATIVE
Glucose, UA: NEGATIVE
Ketones, UA: NEGATIVE
Leukocytes,UA: NEGATIVE
Nitrite, UA: NEGATIVE
RBC, UA: NEGATIVE
Specific Gravity, UA: 1.025 (ref 1.005–1.030)
Urobilinogen, Ur: 0.2 mg/dL (ref 0.2–1.0)
pH, UA: 5.5 (ref 5.0–7.5)

## 2023-10-09 LAB — URIC ACID: Uric Acid: 9 mg/dL — ABNORMAL HIGH (ref 3.8–8.4)

## 2023-10-09 LAB — LIPID PANEL
Chol/HDL Ratio: 3.6 {ratio} (ref 0.0–5.0)
Cholesterol, Total: 161 mg/dL (ref 100–199)
HDL: 45 mg/dL (ref >39)
LDL Chol Calc (NIH): 100 mg/dL — ABNORMAL HIGH (ref 0–99)
Triglycerides: 86 mg/dL (ref 0–149)
VLDL Cholesterol Cal: 16 mg/dL (ref 5–40)

## 2023-10-10 ENCOUNTER — Other Ambulatory Visit: Payer: Self-pay | Admitting: *Deleted

## 2023-10-10 DIAGNOSIS — M1000 Idiopathic gout, unspecified site: Secondary | ICD-10-CM

## 2023-10-10 DIAGNOSIS — R748 Abnormal levels of other serum enzymes: Secondary | ICD-10-CM

## 2023-10-10 MED ORDER — ALLOPURINOL 300 MG PO TABS: 300.0000 mg | ORAL_TABLET | Freq: Every day | ORAL | 1 refills | Status: DC

## 2023-10-10 NOTE — Progress Notes (Signed)
Hi Colonel, your metabolic panel overall looks good except the alkaline phosphatase is still elevated it does look a little better than it did last time which is great and your additional liver enzymes are normal so that is reassuring so we will just continue to keep an eye on it.  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  It may be worth getting an ultrasound on your liver.  If that something that you are okay with then please let me know and we really have not looked at your liver in a long time unless somebody else has.   Your total cholesterol and LDL are at goal.  Your uric acid is quite high at 9 this time.  Are you having any problems with your gout usually when it gets this high you tend to be more susceptible to gout flares.  I see that you are not currently on any allopurinol to help reduce that acid level.  If you are having some flares I am happy to get you started back on medication. Your A1c did go up at 6.4 which is in the prediabetes range just remember that 6.5 is full-blown diabetes.  You have always managed to keep it under 6.5 so just really encourage you to work on cutting back on sweets and carbs over the holidays and plan to check again in 6 months.  Blood count and urine sample look normal.  Thyroid looks great.  No bacteria or overall red blood cells in the urine.

## 2023-10-19 ENCOUNTER — Telehealth: Payer: Self-pay

## 2023-10-19 NOTE — Telephone Encounter (Signed)
Patient came into office to drop off [facility] Examination Certificate form to be completed by PCP, form placed in Dr. Norman Herrlich box, thanks.

## 2023-10-22 NOTE — Telephone Encounter (Signed)
Pt called(&came by this morning)

## 2023-10-24 NOTE — Telephone Encounter (Signed)
Given forms today. Copy sent to scan.

## 2023-10-30 ENCOUNTER — Other Ambulatory Visit: Payer: Self-pay | Admitting: Family Medicine

## 2023-10-30 DIAGNOSIS — R931 Abnormal findings on diagnostic imaging of heart and coronary circulation: Secondary | ICD-10-CM

## 2023-10-30 DIAGNOSIS — R748 Abnormal levels of other serum enzymes: Secondary | ICD-10-CM

## 2023-10-30 DIAGNOSIS — R350 Frequency of micturition: Secondary | ICD-10-CM

## 2023-10-30 DIAGNOSIS — R7401 Elevation of levels of liver transaminase levels: Secondary | ICD-10-CM

## 2023-10-30 DIAGNOSIS — I10 Essential (primary) hypertension: Secondary | ICD-10-CM

## 2023-10-30 DIAGNOSIS — R7301 Impaired fasting glucose: Secondary | ICD-10-CM

## 2023-11-05 ENCOUNTER — Ambulatory Visit: Payer: BC Managed Care – PPO

## 2023-11-05 DIAGNOSIS — R748 Abnormal levels of other serum enzymes: Secondary | ICD-10-CM | POA: Diagnosis not present

## 2023-11-08 ENCOUNTER — Encounter: Payer: Self-pay | Admitting: Family Medicine

## 2023-11-08 DIAGNOSIS — K760 Fatty (change of) liver, not elsewhere classified: Secondary | ICD-10-CM | POA: Insufficient documentation

## 2023-11-08 NOTE — Progress Notes (Signed)
Alex Odom, ultrasound of your abdomen shows a fatty liver.  This can cause inflammation and can cause your liver enzymes to be elevated.  They did not see any other concerning findings which is great.  Kidneys look good as well.  Spleen also look normal.  The treatment for fatty liver is to work on cleaning up your diet reducing processed foods and sugary foods.  More fresh vegetables lean proteins and minimizing carbohydrates.  This usually results in a little bit of weight loss as well as fat reduction in the liver and reduces that inflammation.  Would like to recheck your liver enzymes again in 6 months.

## 2023-11-12 ENCOUNTER — Ambulatory Visit (HOSPITAL_BASED_OUTPATIENT_CLINIC_OR_DEPARTMENT_OTHER)
Admission: RE | Admit: 2023-11-12 | Discharge: 2023-11-12 | Disposition: A | Discharge status: Home / Self Care | Payer: BC Managed Care – PPO | Source: Ambulatory Visit | Attending: Family Medicine | Admitting: Family Medicine

## 2023-11-12 DIAGNOSIS — R931 Abnormal findings on diagnostic imaging of heart and coronary circulation: Secondary | ICD-10-CM | POA: Diagnosis not present

## 2023-11-12 LAB — ECHOCARDIOGRAM COMPLETE
AR max vel: 2.59 cm2
AV Area VTI: 2.69 cm2
AV Area mean vel: 2.54 cm2
AV Mean grad: 4 mm[Hg]
AV Peak grad: 6.9 mm[Hg]
Ao pk vel: 1.31 m/s
Area-P 1/2: 3.39 cm2
Calc EF: 58.6 %
S' Lateral: 3.8 cm
Single Plane A2C EF: 59.6 %
Single Plane A4C EF: 57.9 %

## 2023-11-13 ENCOUNTER — Encounter: Payer: Self-pay | Admitting: Sports Medicine

## 2023-11-13 ENCOUNTER — Ambulatory Visit: Payer: BC Managed Care – PPO | Admitting: Sports Medicine

## 2023-11-13 DIAGNOSIS — M170 Bilateral primary osteoarthritis of knee: Secondary | ICD-10-CM | POA: Diagnosis not present

## 2023-11-13 NOTE — Progress Notes (Signed)
    Procedures performed today:    None.  Independent interpretation of notes and tests performed by another provider:   None.  Brief History, Exam, Impression, and Recommendations:    Primary osteoarthritis of both knees Hx gout, OA, much better after mobic  and home PT. Return PRN.    ____________________________________________ Debby PARAS. Curtis, M.D., ABFM., CAQSM., AME. Primary Care and Sports Medicine [facility]  Adjunct Professor of [facility] Medicine  [employer]  [employer], [employer]

## 2023-11-13 NOTE — Assessment & Plan Note (Signed)
Hx gout, OA, much better after mobic and home PT. Return PRN.

## 2023-11-27 ENCOUNTER — Ambulatory Visit (INDEPENDENT_AMBULATORY_CARE_PROVIDER_SITE_OTHER): Payer: 59 | Admitting: Family Medicine

## 2023-11-27 VITALS — BP 159/81 | HR 56 | Ht 72.0 in | Wt 289.0 lb

## 2023-11-27 DIAGNOSIS — I10 Essential (primary) hypertension: Secondary | ICD-10-CM

## 2023-11-27 DIAGNOSIS — M1A0720 Idiopathic chronic gout, left ankle and foot, without tophus (tophi): Secondary | ICD-10-CM

## 2023-11-27 DIAGNOSIS — Q208 Other congenital malformations of cardiac chambers and connections: Secondary | ICD-10-CM | POA: Insufficient documentation

## 2023-11-27 DIAGNOSIS — R7301 Impaired fasting glucose: Secondary | ICD-10-CM

## 2023-11-27 DIAGNOSIS — K760 Fatty (change of) liver, not elsewhere classified: Secondary | ICD-10-CM

## 2023-11-27 MED ORDER — VALSARTAN-HYDROCHLOROTHIAZIDE 160-12.5 MG PO TABS: 1.0000 | ORAL_TABLET | Freq: Every day | ORAL | 3 refills | Status: DC

## 2023-11-27 NOTE — Assessment & Plan Note (Signed)
 We discussed the importance of getting blood pressure under really tight control and to reduce hypertrophy of that left ventricle.  BP is already improved today which is fantastic.  Will increase valsartan  to valsartan  HCT and follow-up in 1 month for nurse visit to recheck pressure.  Continue to follow BPs at home.

## 2023-11-27 NOTE — Assessment & Plan Note (Signed)
 A1c was 6.4 we discussed that this is borderline for full-blown diabetes and to really get back on track he says he knows he needs to work on his weight loss we discussed cutting back on sweets and carbs intake.

## 2023-11-27 NOTE — Progress Notes (Signed)
   Established Patient Office Visit  Subjective  Patient ID: Alex Odom, male    DOB: [birth year]  Age: 53 y.o. MRN: [id number]  Chief Complaint  Patient presents with   Hypertension    HPI Hypertension- Pt denies chest pain, SOB, dizziness, or heart palpitations.  Taking meds as directed w/o problems. New start ARB bc of high BPS. Denies medication side effects.  Recent echo showed mild LV wall thickening.    F/U gout - uric acid 9.0.   Impaired fasting glucose-no increased thirst or urination. No symptoms consistent with hypoglycemia.   Lab Results  Component Value Date   HGBA1C 6.4 (H) 10/08/2023    Would like to donate plasma and brought form with him.     ROS    Objective:     BP (!) 159/81   Pulse (!) 56   Ht 6' (1.829 m)   Wt 289 lb (131.1 kg)   SpO2 98%   BMI 39.20 kg/m    Physical Exam Vitals and nursing note reviewed.  Constitutional:      Appearance: Normal appearance.  HENT:     Head: Normocephalic and atraumatic.  Eyes:     Conjunctiva/sclera: Conjunctivae normal.  Cardiovascular:     Rate and Rhythm: Normal rate and regular rhythm.  Pulmonary:     Effort: Pulmonary effort is normal.     Breath sounds: Normal breath sounds.  Skin:    General: Skin is warm and dry.  Neurological:     Mental Status: He is alert.  Psychiatric:        Mood and Affect: Mood normal.     No results found for any visits on 11/27/23.    The 10-year ASCVD risk score (Arnett DK, et al., 2019) is: 15%    Assessment & Plan:   Problem List Items Addressed This Visit       Cardiovascular and Mediastinum   Essential hypertension - Primary   Improved.  Will increase valsartan  dose as well as add a low-dose of HCTZ.  Follow-up in 1 month for nurse visit in 3 months with PCP.        Relevant Medications   valsartan -hydrochlorothiazide  (DIOVAN -HCT) 160-12.5 MG tablet   Abnormality of left ventricle of heart   We discussed the importance of getting blood  pressure under really tight control and to reduce hypertrophy of that left ventricle.  BP is already improved today which is fantastic.  Will increase valsartan  to valsartan  HCT and follow-up in 1 month for nurse visit to recheck pressure.  Continue to follow BPs at home.      Relevant Medications   valsartan -hydrochlorothiazide  (DIOVAN -HCT) 160-12.5 MG tablet     Digestive   Fatty liver   Is planning on working on some weight loss.        Endocrine   IFG (impaired fasting glucose)   A1c was 6.4 we discussed that this is borderline for full-blown diabetes and to really get back on track he says he knows he needs to work on his weight loss we discussed cutting back on sweets and carbs intake.        Other   Gout   Will add low-dose of HCTZ and will just need to monitor for increased flares if it becomes a problem then we can stop the HCTZ component.       Return in about 4 weeks (around 12/25/2023) for Nurse BP Check.    Dorothyann Byars, MD

## 2023-11-27 NOTE — Progress Notes (Signed)
 Home BP cuff reading today 157/92 P: 60

## 2023-11-27 NOTE — Assessment & Plan Note (Signed)
 Is planning on working on some weight loss.

## 2023-11-27 NOTE — Assessment & Plan Note (Addendum)
 Improved.  Will increase valsartan dose as well as add a low-dose of HCTZ.  Follow-up in 1 month for nurse visit in 3 months with PCP.

## 2023-11-27 NOTE — Assessment & Plan Note (Signed)
 Will add low-dose of HCTZ and will just need to monitor for increased flares if it becomes a problem then we can stop the HCTZ component.

## 2023-12-17 ENCOUNTER — Telehealth: Payer: Self-pay | Admitting: Family Medicine

## 2023-12-17 NOTE — Telephone Encounter (Signed)
Needs appt, ok for in person or virtual.

## 2023-12-17 NOTE — Telephone Encounter (Unsigned)
Copied from CRM [id number]. Topic: Clinical - Medication Question >> Dec 17, 2023  9:48 AM Gildardo Pounds wrote: Reason for CRM: Can you send on a prescription for predniSONE (DELTASONE) 20 MG tablet because his sarcoidosis is flaring up. Callback number is [phone]

## 2023-12-20 ENCOUNTER — Telehealth: Payer: 59 | Admitting: Family Medicine

## 2023-12-20 ENCOUNTER — Encounter: Payer: Self-pay | Admitting: Family Medicine

## 2023-12-20 DIAGNOSIS — J849 Interstitial pulmonary disease, unspecified: Secondary | ICD-10-CM | POA: Diagnosis not present

## 2023-12-20 DIAGNOSIS — R058 Other specified cough: Secondary | ICD-10-CM

## 2023-12-20 MED ORDER — ALBUTEROL SULFATE HFA 108 (90 BASE) MCG/ACT IN AERS: 2.0000 | INHALATION_SPRAY | Freq: Four times a day (QID) | RESPIRATORY_TRACT | 1 refills | Status: DC | PRN

## 2023-12-20 MED ORDER — PREDNISONE 20 MG PO TABS: 40.0000 mg | ORAL_TABLET | Freq: Every day | ORAL | 0 refills | Status: DC

## 2023-12-20 NOTE — Progress Notes (Signed)
    Virtual Visit via Video Note  I connected with Alex Odom on 12/20/23 at 11:30 AM EST by a video enabled telemedicine application and verified that I am speaking with the correct person using two identifiers.   I discussed the limitations of evaluation and management by telemedicine and the availability of in person appointments. The patient expressed understanding and agreed to proceed.  Patient location: at home Provider location: in office  Subjective:    CC:  No chief complaint on file.   HPI:  Says about a 10 days ago went home with HA, + chills and sweats and went straight to bed.  Then next day felt dizzy with cough and congestion from nose and chest. Says feel some better. Cough is better at night but still has a lot of mucous.  He did not test for COVID or flu at home.  Feels like needs something for his chest.  He is noticing some persistent cough and wheezing.  Past medical history, Surgical history, Family history not pertinant except as noted below, Social history, Allergies, and medications have been entered into the medical record, reviewed, and corrections made.    Objective:    General: Speaking clearly in complete sentences without any shortness of breath.  Alert and oriented x3.  Normal judgment. No apparent acute distress.    Impression and Recommendations:    Problem List Items Addressed This Visit       Respiratory   Interstitial lung disease (HCC)   Relevant Medications   albuterol  (VENTOLIN  HFA) 108 (90 Base) MCG/ACT inhaler   Other Visit Diagnoses       Post-viral cough syndrome    -  Primary   Relevant Medications   predniSONE  (DELTASONE ) 20 MG tablet       Since he has had some persistent cough and wheezing will send over a round of prednisone .  Also like to send over an updated albuterol  inhaler he does not have an active albuterol  at home and want him to use 2 to 3 puffs as needed for the wheezing as well if not improving or  suddenly gets worse please let us  know.  He also has a history of interstitial lung disease.  No orders of the defined types were placed in this encounter.   Meds ordered this encounter  Medications   predniSONE  (DELTASONE ) 20 MG tablet    Sig: Take 2 tablets (40 mg total) by mouth daily with breakfast.    Dispense:  10 tablet    Refill:  0   albuterol  (VENTOLIN  HFA) 108 (90 Base) MCG/ACT inhaler    Sig: Inhale 2 puffs into the lungs every 6 (six) hours as needed for wheezing or shortness of breath.    Dispense:  18 g    Refill:  1     I discussed the assessment and treatment plan with the patient. The patient was provided an opportunity to ask questions and all were answered. The patient agreed with the plan and demonstrated an understanding of the instructions.   The patient was advised to call back or seek an in-person evaluation if the symptoms worsen or if the condition fails to improve as anticipated.   Dorothyann Byars, MD

## 2023-12-20 NOTE — Progress Notes (Signed)
 Pt is requesting a refill on prednisone  for Sarcoidosis.  He reports that he has just gotten over a cold and is hoping that this will help.

## 2023-12-25 ENCOUNTER — Ambulatory Visit: Payer: 59

## 2023-12-26 ENCOUNTER — Ambulatory Visit: Payer: Self-pay | Admitting: Family Medicine

## 2023-12-26 NOTE — Telephone Encounter (Signed)
Chief Complaint: Cough, headache Symptoms: Productive cough, wheezing, headache, low grade fever Frequency: 3 weeks Pertinent Negatives: Patient denies n/a Disposition: [] ED /[] Urgent Care (no appt availability in office) / [x] Appointment(In office/virtual)/ []  [facility]/ [] Home Care/ [] Refused Recommended Disposition /[] [place] [facility]/ []  Follow-up with PCP Additional Notes: Patient states he was seen via e-visit on 02/06 for a cough/cold and he was prescribed an inhaler and prednisone. Patient has finished prednisone and states cough is not improving and still getting worse. Patient is coughing up yellow phlegm and is running a low grade temperature. Patient states he is wheezing now, as well. In-office appt created for tomorrow for further evaluation.    Copied from CRM [id number]. Topic: Clinical - Red Word Triage >> Dec 26, 2023  9:52 AM Elle L wrote: Red Word that prompted transfer to Nurse Triage: The patient was prescribed an inhaler and medication at his last appointment on 2/6. However, his cough is worsening and it is causing him head pain. Reason for Disposition  [1] Continuous (nonstop) coughing interferes with work or school AND [2] no improvement using cough treatment per Care Advice  Answer Assessment - Initial Assessment Questions 1. ONSET: "When did the cough begin?"      3 weeks 2. SEVERITY: "How bad is the cough today?"      Last night I couldn't hardly sleep 3. SPUTUM: "Describe the color of your sputum" (none, dry cough; clear, white, yellow, green)     Yellow 4. HEMOPTYSIS: "Are you coughing up any blood?" If so ask: "How much?" (flecks, streaks, tablespoons, etc.)     No 5. DIFFICULTY BREATHING: "Are you having difficulty breathing?" If Yes, ask: "How bad is it?" (e.g., mild, moderate, severe)    - MILD: No SOB at rest, mild SOB with walking, speaks normally in sentences, can lie down, no retractions, pulse < 100.    - MODERATE: SOB at rest, SOB  with minimal exertion and prefers to sit, cannot lie down flat, speaks in phrases, mild retractions, audible wheezing, pulse 100-120.    - SEVERE: Very SOB at rest, speaks in single words, struggling to breathe, sitting hunched forward, retractions, pulse > 120      Mild with cough attacks 6. FEVER: "Do you have a fever?" If Yes, ask: "What is your temperature, how was it measured, and when did it start?"     Yes - 99 7. CARDIAC HISTORY: "Do you have any history of heart disease?" (e.g., heart attack, congestive heart failure)      No 8. LUNG HISTORY: "Do you have any history of lung disease?"  (e.g., pulmonary embolus, asthma, emphysema)     Sarcoidosis 9. PE RISK FACTORS: "Do you have a history of blood clots?" (or: recent major surgery, recent prolonged travel, bedridden)     no 10. OTHER SYMPTOMS: "Do you have any other symptoms?" (e.g., runny nose, wheezing, chest pain)       Chest pain due to cough, runny nose, wheezing  Protocols used: Cough - Acute Productive-A-AH

## 2023-12-27 ENCOUNTER — Ambulatory Visit: Payer: 59

## 2023-12-27 ENCOUNTER — Encounter: Payer: Self-pay | Admitting: Family Medicine

## 2023-12-27 ENCOUNTER — Ambulatory Visit (INDEPENDENT_AMBULATORY_CARE_PROVIDER_SITE_OTHER): Payer: 59 | Admitting: Family Medicine

## 2023-12-27 VITALS — BP 154/83 | HR 78 | Temp 98.5°F | Ht 72.0 in | Wt 280.0 lb

## 2023-12-27 DIAGNOSIS — J22 Unspecified acute lower respiratory infection: Secondary | ICD-10-CM

## 2023-12-27 DIAGNOSIS — R059 Cough, unspecified: Secondary | ICD-10-CM | POA: Diagnosis not present

## 2023-12-27 DIAGNOSIS — R058 Other specified cough: Secondary | ICD-10-CM

## 2023-12-27 DIAGNOSIS — R062 Wheezing: Secondary | ICD-10-CM | POA: Diagnosis not present

## 2023-12-27 DIAGNOSIS — R918 Other nonspecific abnormal finding of lung field: Secondary | ICD-10-CM

## 2023-12-27 MED ORDER — PREDNISONE 20 MG PO TABS: 40.0000 mg | ORAL_TABLET | Freq: Every day | ORAL | 0 refills | Status: DC

## 2023-12-27 MED ORDER — AMOXICILLIN-POT CLAVULANATE 875-125 MG PO TABS: 1.0000 | ORAL_TABLET | Freq: Two times a day (BID) | ORAL | 0 refills | Status: DC

## 2023-12-27 NOTE — Progress Notes (Signed)
Acute Office Visit  Subjective:     Patient ID: Alex Odom, male    DOB: 02-May-1971, 53 y.o.   MRN: [id number]  Chief Complaint  Patient presents with   Cough    Cough onset 3 weeks , pt c/o night sweats and night chills     HPI Patient is in today for cough x 3 weeks.  It started with what he felt like was a cold. We actually did a video visit about a week ago as he still had a residual cough.  It sounded like it was most consistent with postinfectious cough and so we did a round of prednisone.  And also sent in an active albuterol inhaler.  Now a week later he does not really feel any better he still just feeling tired and still having coughing fits sometimes productive sputum sometimes more dry.  No fever or chills like he had initially.  And no shortness of breath.  But he is worried about the possibility of sarcoidosis returning.  He is also lost some weight but has had a decreased appetite  ROS      Objective:    BP (!) 154/83   Pulse 78   Temp 98.5 F (36.9 C) (Oral)   Ht 6' (1.829 m)   Wt 280 lb (127 kg)   SpO2 98%   BMI 37.97 kg/m    Physical Exam Constitutional:      Appearance: Normal appearance.  HENT:     Head: Normocephalic and atraumatic.     Right Ear: Tympanic membrane, ear canal and external ear normal. There is no impacted cerumen.     Left Ear: Tympanic membrane, ear canal and external ear normal. There is no impacted cerumen.     Nose: Nose normal.     Mouth/Throat:     Pharynx: Oropharynx is clear.  Eyes:     Conjunctiva/sclera: Conjunctivae normal.  Cardiovascular:     Rate and Rhythm: Normal rate and regular rhythm.  Pulmonary:     Effort: Pulmonary effort is normal.     Breath sounds: Wheezing present.     Comments: Wheezing at the right lung base.  Musculoskeletal:     Cervical back: Neck supple. No tenderness.  Lymphadenopathy:     Cervical: No cervical adenopathy.  Skin:    General: Skin is warm and dry.  Neurological:      Mental Status: He is alert and oriented to person, place, and time.  Psychiatric:        Mood and Affect: Mood normal.     No results found for any visits on 12/27/23.      Assessment & Plan:   Problem List Items Addressed This Visit   None Visit Diagnoses       Lower resp. tract infection    -  Primary   Relevant Orders   DG Chest 2 View     Post-viral cough syndrome       Relevant Medications   predniSONE (DELTASONE) 20 MG tablet      This has been going on for 3 weeks and he does not feel like he is getting significantly better even after albuterol and a round of prednisone and going to go ahead and treat with Augmentin and prednisone he does have a prior history of interstitial lung disease.  We can go ahead and get a chest x-ray since he still has some significant wheezing in that right lower lobe.  Call if not better in  1 week.  Meds ordered this encounter  Medications   amoxicillin-clavulanate (AUGMENTIN) 875-125 MG tablet    Sig: Take 1 tablet by mouth 2 (two) times daily.    Dispense:  14 tablet    Refill:  0   predniSONE (DELTASONE) 20 MG tablet    Sig: Take 2 tablets (40 mg total) by mouth daily with breakfast.    Dispense:  10 tablet    Refill:  0    No follow-ups on file.  Nani Gasser, MD

## 2024-01-04 ENCOUNTER — Other Ambulatory Visit: Payer: Self-pay | Admitting: Family Medicine

## 2024-01-04 DIAGNOSIS — I10 Essential (primary) hypertension: Secondary | ICD-10-CM

## 2024-01-07 ENCOUNTER — Ambulatory Visit: Payer: Self-pay | Admitting: Family Medicine

## 2024-01-07 DIAGNOSIS — R058 Other specified cough: Secondary | ICD-10-CM

## 2024-01-07 NOTE — Telephone Encounter (Signed)
 Copied from CRM [id number]. Topic: Clinical - Medical Advice >> Jan 07, 2024 11:09 AM Dondra Prader A wrote: Reason for CRM: Patient is wanting another round of medication sent to the pharmacy: predniSONE (DELTASONE) 20 MG tablet.  Patient states that he does not think enough was prescribed for him and that another round would take care of it, he is starting to feel better but not all the way there yet.   [facility] [id number] - [place], [place] - [address]  Phone: [phone] Fax: [phone]   Chief Complaint: Cough Symptoms: Cough, mild shortness of breath  Frequency: Improved  Pertinent Negatives: Patient denies fever Disposition: [] ED /[] Urgent Care (no appt availability in office) / [] Appointment(In office/virtual)/ []  [facility]/ [x] Home Care/ [] Refused Recommended Disposition /[] [facility]/ []  Follow-up with PCP Additional Notes: Patient states he was seen last week and prescribed Prednisone for an upper respiratory infection. Patient states his symptoms have improved but he is still experiencing mild cough and shortness of breath and would like more Prednisone if possible. Patient is also requesting results from his chest x-ray.      Reason for Disposition  Cough  Answer Assessment - Initial Assessment Questions 1. ONSET: "When did the cough begin?"      Over a week ago 2. SEVERITY: "How bad is the cough today?"      Mild 3. SPUTUM: "Describe the color of your sputum" (none, dry cough; clear, white, yellow, green)     No 4. HEMOPTYSIS: "Are you coughing up any blood?" If so ask: "How much?" (flecks, streaks, tablespoons, etc.)     No 5. DIFFICULTY BREATHING: "Are you having difficulty breathing?" If Yes, ask: "How bad is it?" (e.g., mild, moderate, severe)    - MILD: No SOB at rest, mild SOB with walking, speaks normally in sentences, can lie down, no retractions, pulse < 100.    - MODERATE: SOB at rest, SOB with minimal exertion and prefers to  sit, cannot lie down flat, speaks in phrases, mild retractions, audible wheezing, pulse 100-120.    - SEVERE: Very SOB at rest, speaks in single words, struggling to breathe, sitting hunched forward, retractions, pulse > 120      Mild 6. FEVER: "Do you have a fever?" If Yes, ask: "What is your temperature, how was it measured, and when did it start?"     No 7. CARDIAC HISTORY: "Do you have any history of heart disease?" (e.g., heart attack, congestive heart failure)      No 8. LUNG HISTORY: "Do you have any history of lung disease?"  (e.g., pulmonary embolus, asthma, emphysema)     Interstitial lung disease  9. PE RISK FACTORS: "Do you have a history of blood clots?" (or: recent major surgery, recent prolonged travel, bedridden)     No 10. OTHER SYMPTOMS: "Do you have any other symptoms?" (e.g., runny nose, wheezing, chest pain)       No  Protocols used: Cough - Acute Non-Productive-A-AH

## 2024-01-07 NOTE — Telephone Encounter (Signed)
 Copied from CRM [id number]. Topic: Clinical - Medical Advice >> Jan 07, 2024 11:09 AM Dondra Prader A wrote: Reason for CRM: Patient is wanting another round of medication sent to the pharmacy: predniSONE (DELTASONE) 20 MG tablet.  Patient states that he does not think enough was prescribed for him and that another round would take care of it, he is starting to feel better but not all the way there yet.     [phone] [place], [place] - [address]  Phone: [phone] Fax: [phone]

## 2024-01-08 ENCOUNTER — Encounter: Payer: Self-pay | Admitting: Family Medicine

## 2024-01-08 MED ORDER — PREDNISONE 20 MG PO TABS: 40.0000 mg | ORAL_TABLET | Freq: Every day | ORAL | 0 refills | Status: DC

## 2024-01-08 NOTE — Telephone Encounter (Signed)
 Refill of prednisone sent to pharmacy but if he is not feeling better by the end of this week then he will likely need a follow-up appointment and maybe even a chest CT.

## 2024-01-08 NOTE — Addendum Note (Signed)
 Addended by: Nani Gasser D on: 01/08/2024 05:46 PM   Modules accepted: Orders

## 2024-01-08 NOTE — Progress Notes (Signed)
 Alex Odom we finally got your chest x-ray back.  It does show what looks like inflammation from probably a virus.  Are you feeling a lot better?  Did you finish the antibiotic?  Are you still having any concerns?

## 2024-02-13 ENCOUNTER — Ambulatory Visit: Payer: BC Managed Care – PPO | Admitting: Cardiology

## 2024-02-25 ENCOUNTER — Ambulatory Visit: Payer: 59 | Admitting: Family Medicine

## 2024-02-25 DIAGNOSIS — R7301 Impaired fasting glucose: Secondary | ICD-10-CM

## 2024-02-25 DIAGNOSIS — I10 Essential (primary) hypertension: Secondary | ICD-10-CM

## 2024-03-26 ENCOUNTER — Ambulatory Visit: Payer: Self-pay

## 2024-03-26 NOTE — Telephone Encounter (Signed)
 See if he can come in about 5 minutes before 8 AM on Friday , I can try to go ahead and see him before my first patient at 810.

## 2024-03-26 NOTE — Telephone Encounter (Signed)
 Patient informed and is agreeable to time for scheduling

## 2024-03-26 NOTE — Telephone Encounter (Signed)
  Chief Complaint: Congestion Symptoms: cough, congestion Frequency: Constant Pertinent Negatives: Patient denies shortness of breath, chest pain Disposition: [] ED /[x] Urgent Care (no appt availability in office) / [] Appointment(In office/virtual)/ []  [facility]/ [] Home Care/ [x] Refused Recommended Disposition /[] [facility]/ []  Follow-up with PCP Additional Notes:  Requesting appointment with PCP for upper respiratory symptoms that started on Monday. Chest congestion, nasal congestion, feels feverish but hasn't measured temperature,  productive cough of light brown. Mild central chest pain when coughing. Denies all other symptoms. Acute evaluation advised. No appointments available today with pcp or practice location, patient declines available appointments on 03/27/24 as he needs early morning due to work hours. Advised urgent care for evaluation, he declines urgent care and requesting Dr. Greer Leak schedule him an appointment 03/27/24 early morning before 9. Please review to see if any availability and follow up.   Copied from CRM [id number]. Topic: Clinical - Red Word Triage >> Mar 26, 2024  9:28 AM Carrielelia G wrote: Red Word that prompted transfer to Nurse Triage:  sob, fever, chest and nose congestion. Reason for Disposition  Coughing up rusty-colored (reddish-brown) sputum  Protocols used: Cough - Acute Productive-A-AH

## 2024-03-28 ENCOUNTER — Encounter: Payer: Self-pay | Admitting: Family Medicine

## 2024-03-28 ENCOUNTER — Ambulatory Visit (INDEPENDENT_AMBULATORY_CARE_PROVIDER_SITE_OTHER): Admitting: Family Medicine

## 2024-03-28 VITALS — BP 132/78 | HR 63

## 2024-03-28 DIAGNOSIS — D869 Sarcoidosis, unspecified: Secondary | ICD-10-CM | POA: Diagnosis not present

## 2024-03-28 DIAGNOSIS — J849 Interstitial pulmonary disease, unspecified: Secondary | ICD-10-CM | POA: Diagnosis not present

## 2024-03-28 DIAGNOSIS — J069 Acute upper respiratory infection, unspecified: Secondary | ICD-10-CM

## 2024-03-28 DIAGNOSIS — K760 Fatty (change of) liver, not elsewhere classified: Secondary | ICD-10-CM

## 2024-03-28 DIAGNOSIS — I10 Essential (primary) hypertension: Secondary | ICD-10-CM

## 2024-03-28 DIAGNOSIS — R7301 Impaired fasting glucose: Secondary | ICD-10-CM

## 2024-03-28 MED ORDER — PREDNISONE 20 MG PO TABS: 40.0000 mg | ORAL_TABLET | Freq: Every day | ORAL | 0 refills | Status: DC

## 2024-03-28 NOTE — Assessment & Plan Note (Signed)
 Recheck function.

## 2024-03-28 NOTE — Progress Notes (Signed)
 Acute Office Visit  Subjective:     Patient ID: Alex Odom, male    DOB: [birth year], 53 y.o.   MRN: [id number]  Chief Complaint  Patient presents with   Cough    HPI Patient is in today for cough x 5 days with mucous.Had some prednisone  left over from Feb and took some and that has helped him.    Chest congestion, nasal congestion, feels feverish but hasn't measured temperature,  productive cough of light brown. Mild central chest pain when coughing.   Hypertension- Pt denies chest pain, SOB, dizziness, or heart palpitations.  Taking meds as directed w/o problems.  Denies medication side effects.     ROS      Objective:    BP 132/78   Pulse 63   SpO2 97%    Physical Exam Constitutional:      Appearance: Normal appearance.  HENT:     Head: Normocephalic and atraumatic.     Right Ear: Tympanic membrane, ear canal and external ear normal. There is no impacted cerumen.     Left Ear: Tympanic membrane, ear canal and external ear normal. There is no impacted cerumen.     Nose: Nose normal.     Mouth/Throat:     Pharynx: Oropharynx is clear.  Eyes:     Conjunctiva/sclera: Conjunctivae normal.  Cardiovascular:     Rate and Rhythm: Normal rate and regular rhythm.  Pulmonary:     Effort: Pulmonary effort is normal.     Breath sounds: Normal breath sounds.  Musculoskeletal:     Cervical back: Neck supple. No tenderness.  Lymphadenopathy:     Cervical: No cervical adenopathy.  Skin:    General: Skin is warm and dry.  Neurological:     Mental Status: He is alert and oriented to person, place, and time.  Psychiatric:        Mood and Affect: Mood normal.     No results found for any visits on 03/28/24.      Assessment & Plan:   Problem List Items Addressed This Visit       Cardiovascular and Mediastinum   Essential hypertension   BP looks better today. Due for updated labs.        Relevant Orders   CMP14+EGFR   CBC with Differential/Platelet    Hemoglobin A1c   Uric acid     Respiratory   Interstitial lung disease (HCC)   Encouraged him to get back in with Pulmonology.       Relevant Medications   predniSONE  (DELTASONE ) 20 MG tablet   Other Relevant Orders   CMP14+EGFR   CBC with Differential/Platelet   Hemoglobin A1c   Uric acid     Digestive   Fatty liver   Recheck function.       Relevant Orders   CMP14+EGFR   CBC with Differential/Platelet   Hemoglobin A1c   Uric acid     Endocrine   IFG (impaired fasting glucose)   A1C due.        Relevant Orders   CMP14+EGFR   CBC with Differential/Platelet   Hemoglobin A1c   Uric acid     Other   Sarcoidosis - Primary   Relevant Orders   CMP14+EGFR   CBC with Differential/Platelet   Hemoglobin A1c   Uric acid   Other Visit Diagnoses       Viral upper respiratory tract infection          Most conssitant with viral URI.  Will continue his prednisone . If not better after weekend consider ABX.    Meds ordered this encounter  Medications   predniSONE  (DELTASONE ) 20 MG tablet    Sig: Take 2 tablets (40 mg total) by mouth daily with breakfast.    Dispense:  10 tablet    Refill:  0    Return in about 6 months (around 09/28/2024) for Hypertension.  Duaine German, MD

## 2024-03-28 NOTE — Assessment & Plan Note (Signed)
 BP looks better today. Due for updated labs.

## 2024-03-28 NOTE — Assessment & Plan Note (Signed)
 A1C due?

## 2024-03-28 NOTE — Assessment & Plan Note (Signed)
 Encouraged him to get back in with Pulmonology.

## 2024-03-29 LAB — CBC WITH DIFFERENTIAL/PLATELET
Basophils Absolute: 0.1 10*3/uL (ref 0.0–0.2)
Basos: 2 %
EOS (ABSOLUTE): 0.2 10*3/uL (ref 0.0–0.4)
Eos: 5 %
Hematocrit: 49.6 % (ref 37.5–51.0)
Hemoglobin: 15.4 g/dL (ref 13.0–17.7)
Immature Grans (Abs): 0 10*3/uL (ref 0.0–0.1)
Immature Granulocytes: 0 %
Lymphocytes Absolute: 1.3 10*3/uL (ref 0.7–3.1)
Lymphs: 33 %
MCH: 27.3 pg (ref 26.6–33.0)
MCHC: 31 g/dL — ABNORMAL LOW (ref 31.5–35.7)
MCV: 88 fL (ref 79–97)
Monocytes Absolute: 0.9 10*3/uL (ref 0.1–0.9)
Monocytes: 21 %
Neutrophils Absolute: 1.5 10*3/uL (ref 1.4–7.0)
Neutrophils: 39 %
Platelets: 145 10*3/uL — ABNORMAL LOW (ref 150–450)
RBC: 5.65 x10E6/uL (ref 4.14–5.80)
RDW: 14.9 % (ref 11.6–15.4)
WBC: 4 10*3/uL (ref 3.4–10.8)

## 2024-03-29 LAB — CMP14+EGFR
ALT: 40 IU/L (ref 0–44)
AST: 44 IU/L — ABNORMAL HIGH (ref 0–40)
Albumin: 3.4 g/dL — ABNORMAL LOW (ref 3.8–4.9)
Alkaline Phosphatase: 115 IU/L (ref 44–121)
BUN/Creatinine Ratio: 11 (ref 9–20)
BUN: 16 mg/dL (ref 6–24)
Bilirubin Total: 0.2 mg/dL (ref 0.0–1.2)
CO2: 21 mmol/L (ref 20–29)
Calcium: 8.7 mg/dL (ref 8.7–10.2)
Chloride: 105 mmol/L (ref 96–106)
Creatinine, Ser: 1.42 mg/dL — ABNORMAL HIGH (ref 0.76–1.27)
Globulin, Total: 2.4 g/dL (ref 1.5–4.5)
Glucose: 114 mg/dL — ABNORMAL HIGH (ref 70–99)
Potassium: 4.4 mmol/L (ref 3.5–5.2)
Sodium: 140 mmol/L (ref 134–144)
Total Protein: 5.8 g/dL — ABNORMAL LOW (ref 6.0–8.5)
eGFR: 58 mL/min/{1.73_m2} — ABNORMAL LOW (ref >59)

## 2024-03-29 LAB — HEMOGLOBIN A1C
Est. average glucose Bld gHb Est-mCnc: 128 mg/dL
Hgb A1c MFr Bld: 6.1 % — ABNORMAL HIGH (ref 4.8–5.6)

## 2024-03-29 LAB — URIC ACID: Uric Acid: 8.5 mg/dL — ABNORMAL HIGH (ref 3.8–8.4)

## 2024-03-31 ENCOUNTER — Ambulatory Visit: Payer: Self-pay | Admitting: Family Medicine

## 2024-03-31 NOTE — Progress Notes (Signed)
 Hi Kriss, kidney function jumped up normally you are between 1.1 and 1.2.  It was 1.4 this time so wanted keep a close eye on that and plan to recheck your level again in about 2 to 3 weeks with a BMP.  Also your AST liver enzyme jumped up slightly it has been up in the past but had come down the last couple of times.  Just continue to avoid any excess Tylenol or alcohol products.  Blood count looks great no sign of anemia.  A1c looked great at 6.1.  Uric acid level is still elevated at 8.5 but does look a little bit better than it did 5 months ago.  We could always consider putting you back on allopurinol  for your gout.  If you want to manage it with a low purine diet and see how you do that is fine as well.

## 2024-07-15 ENCOUNTER — Encounter: Payer: Self-pay | Admitting: Sports Medicine

## 2024-09-29 ENCOUNTER — Encounter: Payer: Self-pay | Admitting: Family Medicine

## 2024-09-29 ENCOUNTER — Ambulatory Visit: Admitting: Family Medicine

## 2024-09-29 VITALS — BP 136/82 | HR 55 | Ht 72.0 in | Wt 291.1 lb

## 2024-09-29 DIAGNOSIS — Z23 Encounter for immunization: Secondary | ICD-10-CM | POA: Diagnosis not present

## 2024-09-29 DIAGNOSIS — I428 Other cardiomyopathies: Secondary | ICD-10-CM | POA: Diagnosis not present

## 2024-09-29 DIAGNOSIS — M25562 Pain in left knee: Secondary | ICD-10-CM

## 2024-09-29 DIAGNOSIS — I10 Essential (primary) hypertension: Secondary | ICD-10-CM

## 2024-09-29 DIAGNOSIS — R7301 Impaired fasting glucose: Secondary | ICD-10-CM

## 2024-09-29 DIAGNOSIS — M170 Bilateral primary osteoarthritis of knee: Secondary | ICD-10-CM | POA: Diagnosis not present

## 2024-09-29 DIAGNOSIS — D869 Sarcoidosis, unspecified: Secondary | ICD-10-CM

## 2024-09-29 DIAGNOSIS — J849 Interstitial pulmonary disease, unspecified: Secondary | ICD-10-CM

## 2024-09-29 MED ORDER — ALBUTEROL SULFATE HFA 108 (90 BASE) MCG/ACT IN AERS: 2.0000 | INHALATION_SPRAY | Freq: Four times a day (QID) | RESPIRATORY_TRACT | 1 refills | Status: AC | PRN

## 2024-09-29 MED ORDER — PREDNISONE 20 MG PO TABS: 40.0000 mg | ORAL_TABLET | Freq: Every day | ORAL | 0 refills | Status: AC

## 2024-09-29 MED ORDER — VALSARTAN-HYDROCHLOROTHIAZIDE 160-12.5 MG PO TABS: 1.0000 | ORAL_TABLET | Freq: Every day | ORAL | 3 refills | Status: AC

## 2024-09-29 MED ORDER — MELOXICAM 15 MG PO TABS: 15.0000 mg | ORAL_TABLET | Freq: Every day | ORAL | 3 refills | Status: AC | PRN

## 2024-09-29 NOTE — Assessment & Plan Note (Addendum)
 BMI > 35 + HTN and IFG.  Plans to work on weight loss. Goal of losing 20 lbs.

## 2024-09-29 NOTE — Assessment & Plan Note (Signed)
 Essential hypertension Blood pressure slightly elevated. He has not taken medication this morning. Weight loss may reduce medication need. - Encouraged weight loss to potentially reduce medication need.

## 2024-09-29 NOTE — Assessment & Plan Note (Signed)
  Wheezing, responsive to prednisone  Wheezing occurred with weather change, responsive to prednisone . Albuterol  inhaler status is uncertain. - Refilled prednisone  prescription. - Check albuterol  inhaler status and refill if necessary.

## 2024-09-29 NOTE — Assessment & Plan Note (Signed)
 Due for A1C check today. He has gained some weight.

## 2024-09-29 NOTE — Progress Notes (Signed)
 Established Patient Office Visit  Patient ID: Alex Odom, male    DOB: 03/14/1971  Age: 53 y.o. MRN: [id number] PCP: Alvan Dorothyann BIRCH, MD  Chief Complaint  Patient presents with   Hypertension    Subjective:     HPI  Discussed the use of AI scribe software for clinical note transcription with the patient, who gave verbal consent to proceed.  History of Present Illness Alex Odom is a 53 year old male with asthma and hypertension who presents for management of these conditions.  Asthma like symptoms  - Wheezing occurs with changes in the weather - Wheezing improves significantly with prednisone  - Requests additional prednisone  for symptom control - Uncertain if albuterol  inhaler is up to date but confirms possession  Hypertension and fluid retention - Blood pressure slightly elevated at home after missing morning antihypertensive medication - Believes weight loss could improve blood pressure control - Motivated to lose 20 pounds by next visit - History of regular exercise - Mild fluid retention managed with orthopedic stretch socks - No chest pain or significant swelling  Foot pain - Bunion on foot causing pain - Plans to see a podiatrist for evaluation  Immunization status - Received influenza vaccination - Agrees to receive pneumococcal vaccination, recommended for individuals over 50     ROS    Objective:     BP 136/82   Pulse (!) 55   Ht 6' (1.829 m)   Wt 291 lb 1.3 oz (132 kg)   SpO2 97%   BMI 39.48 kg/m    Physical Exam Vitals and nursing note reviewed.  Constitutional:      Appearance: Normal appearance.  HENT:     Head: Normocephalic and atraumatic.  Eyes:     Conjunctiva/sclera: Conjunctivae normal.  Cardiovascular:     Rate and Rhythm: Normal rate and regular rhythm.  Pulmonary:     Effort: Pulmonary effort is normal.     Breath sounds: Normal breath sounds.  Skin:    General: Skin is warm and dry.  Neurological:      Mental Status: He is alert.  Psychiatric:        Mood and Affect: Mood normal.      No results found for any visits on 09/29/24.    The 10-year ASCVD risk score (Arnett DK, et al., 2019) is: 11.9%    Assessment & Plan:   Problem List Items Addressed This Visit       Cardiovascular and Mediastinum   Nonischemic cardiomyopathy (HCC)   No recent CP, occ swelling but has compression stockings.       Relevant Medications   valsartan -hydrochlorothiazide  (DIOVAN -HCT) 160-12.5 MG tablet   Essential hypertension - Primary   Essential hypertension Blood pressure slightly elevated. He has not taken medication this morning. Weight loss may reduce medication need. - Encouraged weight loss to potentially reduce medication need.      Relevant Medications   valsartan -hydrochlorothiazide  (DIOVAN -HCT) 160-12.5 MG tablet   Other Relevant Orders   HgB A1c   Lipid Panel With LDL/HDL Ratio   BMP8+EGFR     Respiratory   Interstitial lung disease (HCC)    Wheezing, responsive to prednisone  Wheezing occurred with weather change, responsive to prednisone . Albuterol  inhaler status is uncertain. - Refilled prednisone  prescription. - Check albuterol  inhaler status and refill if necessary.      Relevant Medications   predniSONE  (DELTASONE ) 20 MG tablet   albuterol  (VENTOLIN  HFA) 108 (90 Base) MCG/ACT inhaler     Endocrine  IFG (impaired fasting glucose)   Due for A1C check today. He has gained some weight.       Relevant Orders   HgB A1c   Lipid Panel With LDL/HDL Ratio   BMP8+EGFR     Musculoskeletal and Integument   Primary osteoarthritis of both knees   Relevant Medications   meloxicam  (MOBIC ) 15 MG tablet   predniSONE  (DELTASONE ) 20 MG tablet     Other   Sarcoidosis   Morbid obesity (HCC)   BMI > 35 + HTN and IFG.  Plans to work on weight loss. Goal of losing 20 lbs.       Other Visit Diagnoses       Encounter for immunization       Relevant Orders   Flu vaccine  trivalent PF, 6mos and older(Flulaval,Afluria,Fluarix,Fluzone) (Completed)     Left medial knee pain       Relevant Medications   meloxicam  (MOBIC ) 15 MG tablet       Assessment and Plan Assessment & Plan Encounter for immunization Eligible for pneumonia vaccine as he is over 32 years old. Vaccine is a one-time administration with a booster needed around age 50. He has already received the flu shot.   Return in about 6 months (around 03/29/2025) for Hypertension.    Dorothyann Byars, MD [facility] at [facility]

## 2024-09-29 NOTE — Assessment & Plan Note (Signed)
 No recent CP, occ swelling but has compression stockings.

## 2024-09-30 ENCOUNTER — Ambulatory Visit: Payer: Self-pay | Admitting: Family Medicine

## 2024-09-30 DIAGNOSIS — R944 Abnormal results of kidney function studies: Secondary | ICD-10-CM

## 2024-09-30 LAB — BMP8+EGFR
BUN/Creatinine Ratio: 10 (ref 9–20)
BUN: 13 mg/dL (ref 6–24)
CO2: 24 mmol/L (ref 20–29)
Calcium: 9.3 mg/dL (ref 8.7–10.2)
Chloride: 102 mmol/L (ref 96–106)
Creatinine, Ser: 1.36 mg/dL — ABNORMAL HIGH (ref 0.76–1.27)
Glucose: 110 mg/dL — ABNORMAL HIGH (ref 70–99)
Potassium: 4.2 mmol/L (ref 3.5–5.2)
Sodium: 139 mmol/L (ref 134–144)
eGFR: 61 mL/min/1.73 (ref >59)

## 2024-09-30 LAB — LIPID PANEL WITH LDL/HDL RATIO
Cholesterol, Total: 181 mg/dL (ref 100–199)
HDL: 44 mg/dL (ref >39)
LDL Chol Calc (NIH): 118 mg/dL — ABNORMAL HIGH (ref 0–99)
LDL/HDL Ratio: 2.7 ratio (ref 0.0–3.6)
Triglycerides: 105 mg/dL (ref 0–149)
VLDL Cholesterol Cal: 19 mg/dL (ref 5–40)

## 2024-09-30 LAB — HEMOGLOBIN A1C
Est. average glucose Bld gHb Est-mCnc: 131 mg/dL
Hgb A1c MFr Bld: 6.2 % — ABNORMAL HIGH (ref 4.8–5.6)

## 2024-09-30 NOTE — Progress Notes (Signed)
 Hi Alex Odom, A1c 6.2 so pretty stable over the last 6 months which is great.  LDL cholesterol is up.  Just encouraged her to continue to work on the weight loss I know you said you were planning on working on that I think that will help your cholesterol numbers and your blood sugar numbers.  The kidney function is still elevated overall it had jumped up from 1.2 to 1.46 months ago it is now down to 1.3 so trending closer to normal but still much higher than we would expect.  I do and keep an eye on this and recheck again in 3 months as well as check for any excess protein in the urine around that time as well.

## 2024-10-01 ENCOUNTER — Ambulatory Visit: Payer: Self-pay | Admitting: *Deleted

## 2024-10-01 NOTE — Telephone Encounter (Signed)
 Second attempt to contact patient for triage, LVM for patient to return call to [phone]. Routed to office for follow up  Copied from CRM #[id number]. Topic: Clinical - Red Word Triage >> Oct 01, 2024  2:00 PM Amy B wrote: Red Word that prompted transfer to Nurse Triage: Gout flare up, both ankles with swelling

## 2024-10-01 NOTE — Telephone Encounter (Signed)
 Patient call disconnected during transfer from PAS. NT called patient back to review sx of gout flare up , in both ankles and swelling. No answer, LVMTCB [phone].      Copied from CRM [id number]. Topic: Clinical - Red Word Triage >> Oct 01, 2024  2:00 PM Amy B wrote: Red Word that prompted transfer to Nurse Triage: Gout flare up, both ankles with swelling

## 2024-10-02 ENCOUNTER — Ambulatory Visit: Payer: Self-pay

## 2024-10-02 DIAGNOSIS — M1A0720 Idiopathic chronic gout, left ankle and foot, without tophus (tophi): Secondary | ICD-10-CM

## 2024-10-02 NOTE — Telephone Encounter (Signed)
 Attempted call to patient for further information regarding symptoms - requesting appt or med refill? Left a voice mail message requesting a return call.

## 2024-10-02 NOTE — Telephone Encounter (Signed)
 FYI Only or Action Required?: Action required by provider: update on patient condition.  Patient was last seen in primary care on 09/29/2024 by Alvan Dorothyann BIRCH, MD.  Called Nurse Triage reporting Gout.  Symptoms began a week ago.  Interventions attempted: OTC medications: ibuprofen.  Symptoms are: bilateral ankle swelling (mild/localized to lateral side of ankles) gradually worsening.  Triage Disposition: Call PCP Now (overriding See PCP When Office is Open (Within 3 Days))  Patient/caregiver understands and will follow disposition?: Yes          Copied from CRM [id number]. Topic: Clinical - Red Word Triage >> Oct 01, 2024  2:00 PM Amy B wrote: Red Word that prompted transfer to Nurse Triage: Gout flare up, both ankles with swelling >> Oct 02, 2024 12:20 PM Amy B wrote: Patient called back to speak with nurse.  Asked patient to stay on the line and not hang up so he can be assisted.  Reason for Disposition  [1] MILD swelling of both ankles (e.g., ankle joints look swollen; or bilateral mild pedal edema) AND [2] new-onset or getting worse  (Exceptions: Caused by hot weather, already seen by doctor or NP/PA for this.)  Answer Assessment - Initial Assessment Questions 1. LOCATION: Which ankle is swollen? Where is the swelling?     Bilateral. Outside/lateral.  2. ONSET: When did the swelling start?     X 1 week.  3. SWELLING: How bad is the swelling? Or, How large is it? (e.g., mild, moderate, severe; size of localized swelling)      Mild/localized puffiness.  4. PAIN: Is there any pain? If Yes, ask: How bad is it? (Scale 0-10; or none, mild, moderate, severe)     Yes, 8/10. He states he pain goes up into his legs a little bit.  5. CAUSE: What do you think caused the ankle swelling?     Gout.  6. OTHER SYMPTOMS: Do you have any other symptoms? (e.g., fever, chest pain, difficulty breathing, calf pain)     Denies chest pain, difficulty breathing,  fever, redness.  Protocols used: Ankle Swelling-A-AH

## 2024-10-03 ENCOUNTER — Other Ambulatory Visit: Payer: Self-pay

## 2024-10-03 DIAGNOSIS — M1A0720 Idiopathic chronic gout, left ankle and foot, without tophus (tophi): Secondary | ICD-10-CM

## 2024-10-03 MED ORDER — COLCHICINE 0.6 MG PO TABS: ORAL_TABLET | ORAL | 1 refills | Status: AC

## 2024-10-03 MED ORDER — COLCHICINE 0.6 MG PO TABS: ORAL_TABLET | ORAL | 1 refills | Status: DC

## 2024-10-03 MED ORDER — ALLOPURINOL 300 MG PO TABS
300.0000 mg | ORAL_TABLET | Freq: Every day | ORAL | 3 refills | Status: DC
Start: 2024-10-03 — End: 2024-10-03

## 2024-10-03 MED ORDER — ALLOPURINOL 300 MG PO TABS: 300.0000 mg | ORAL_TABLET | Freq: Every day | ORAL | 3 refills | Status: AC

## 2024-10-03 MED ORDER — ALLOPURINOL 300 MG PO TABS: 300.0000 mg | ORAL_TABLET | Freq: Every day | ORAL | 3 refills | Status: DC

## 2024-10-03 NOTE — Telephone Encounter (Signed)
 Patient advised.

## 2024-10-03 NOTE — Telephone Encounter (Signed)
 If he is having a flare then we we will use colchicine .  He will start taking that first until the flare calms down and then he can add in the allopurinol .  The allopurinol  is more for maintenance and prevention it will not treat the actual flare.  But then he will continue to take the allopurinol  and the colchicine  for 6 months together.  Then he will stop the colchicine  and just keep taking the allopurinol .

## 2024-10-03 NOTE — Telephone Encounter (Signed)
 This is currently being  addressed in a separate message. This is a duplicate message.

## 2024-10-08 ENCOUNTER — Ambulatory Visit

## 2025-03-30 ENCOUNTER — Ambulatory Visit: Admitting: Family Medicine
